# Patient Record
Sex: Female | Born: 1995
Health system: Southern US, Community
[De-identification: ages and names within clinical notes are randomized; demographics above are authoritative.]

## PROBLEM LIST (undated history)

## (undated) DIAGNOSIS — M549 Dorsalgia, unspecified: Secondary | ICD-10-CM

## (undated) DIAGNOSIS — R51 Headache: Secondary | ICD-10-CM

## (undated) DIAGNOSIS — D649 Anemia, unspecified: Secondary | ICD-10-CM

## (undated) HISTORY — DX: Headache: R51

---
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
## 2015-05-19 ENCOUNTER — Emergency Department (HOSPITAL_COMMUNITY)
Admission: EM | Admit: 2015-05-19 | Discharge: 2015-05-19 | Disposition: A | Discharge status: Home / Self Care | Payer: Self-pay | Attending: Emergency Medicine | Admitting: Emergency Medicine

## 2015-05-19 ENCOUNTER — Encounter (HOSPITAL_COMMUNITY): Payer: Self-pay | Admitting: Emergency Medicine

## 2015-05-19 DIAGNOSIS — Y9289 Other specified places as the place of occurrence of the external cause: Secondary | ICD-10-CM | POA: Insufficient documentation

## 2015-05-19 DIAGNOSIS — S70362A Insect bite (nonvenomous), left thigh, initial encounter: Secondary | ICD-10-CM | POA: Insufficient documentation

## 2015-05-19 DIAGNOSIS — Y998 Other external cause status: Secondary | ICD-10-CM | POA: Insufficient documentation

## 2015-05-19 DIAGNOSIS — W57XXXA Bitten or stung by nonvenomous insect and other nonvenomous arthropods, initial encounter: Secondary | ICD-10-CM | POA: Insufficient documentation

## 2015-05-19 DIAGNOSIS — L02416 Cutaneous abscess of left lower limb: Secondary | ICD-10-CM | POA: Insufficient documentation

## 2015-05-19 DIAGNOSIS — L0291 Cutaneous abscess, unspecified: Secondary | ICD-10-CM

## 2015-05-19 DIAGNOSIS — Y9389 Activity, other specified: Secondary | ICD-10-CM | POA: Insufficient documentation

## 2015-05-19 DIAGNOSIS — Z720 Tobacco use: Secondary | ICD-10-CM | POA: Insufficient documentation

## 2015-05-19 MED ORDER — SULFAMETHOXAZOLE-TRIMETHOPRIM 800-160 MG PO TABS: 1.0000 | ORAL_TABLET | Freq: Two times a day (BID) | ORAL | Status: DC

## 2015-05-19 NOTE — Discharge Instructions (Signed)
Apply warm compresses to affected area throughout the day. Take antibiotic until it is finished. Followup with [facility]Primary Care doctor in 3-5 days for wound recheck. Monitor area for signs of infection to include, but not limited to: increasing pain, redness, drainage/pus, or swelling. Return to emergency department for emergent changing or worsening symptoms.    Abscess An abscess (boil or furuncle) is an infected area on or under the skin. This area is filled with yellowish-white fluid (pus) and other material (debris). HOME CARE   Only take medicines as told by your doctor.  If you were given antibiotic medicine, take it as directed. Finish the medicine even if you start to feel better.  If gauze is used, follow your doctor's directions for changing the gauze.  To avoid spreading the infection:  Keep your abscess covered with a bandage.  Wash your hands well.  Do not share personal care items, towels, or whirlpools with others.  Avoid skin contact with others.  Keep your skin and clothes clean around the abscess.  Keep all doctor visits as told. GET HELP RIGHT AWAY IF:   You have more pain, puffiness (swelling), or redness in the wound site.  You have more fluid or blood coming from the wound site.  You have muscle aches, chills, or you feel sick.  You have a fever. MAKE SURE YOU:   Understand these instructions.  Will watch your condition.  Will get help right away if you are not doing well or get worse. Document Released: 03/10/2008 Document Revised: 03/23/2012 Document Reviewed: 12/05/2011 [facility] Patient Information 2015 [place], [place]. This information is not intended to replace advice given to you by your health care provider. Make sure you discuss any questions you have with your health care provider.  Insect Bite Mosquitoes, flies, fleas, bedbugs, and many other insects can bite. Insect bites are different from insect stings. A sting is  when venom is injected into the skin. Some insect bites can transmit infectious diseases. SYMPTOMS  Insect bites usually turn red, swell, and itch for 2 to 4 days. They often go away on their own. TREATMENT  Your caregiver may prescribe antibiotic medicines if a bacterial infection develops in the bite. HOME CARE INSTRUCTIONS  Do not scratch the bite area.  Keep the bite area clean and dry. Wash the bite area thoroughly with soap and water.  Put ice or cool compresses on the bite area.  Put ice in a plastic bag.  Place a towel between your skin and the bag.  Leave the ice on for 20 minutes, 4 times a day for the first 2 to 3 days, or as directed.  You may apply a baking soda paste, cortisone cream, or calamine lotion to the bite area as directed by your caregiver. This can help reduce itching and swelling.  Only take over-the-counter or prescription medicines as directed by your caregiver.  If you are given antibiotics, take them as directed. Finish them even if you start to feel better. You may need a tetanus shot if:  You cannot remember when you had your last tetanus shot.  You have never had a tetanus shot.  The injury broke your skin. If you get a tetanus shot, your arm may swell, get red, and feel warm to the touch. This is common and not a problem. If you need a tetanus shot and you choose not to have one, there is a rare chance of getting tetanus. Sickness from tetanus can be  serious. SEEK IMMEDIATE MEDICAL CARE IF:   You have increased pain, redness, or swelling in the bite area.  You see a red line on the skin coming from the bite.  You have a fever.  You have joint pain.  You have a headache or neck pain.  You have unusual weakness.  You have a rash.  You have chest pain or shortness of breath.  You have abdominal pain, nausea, or vomiting.  You feel unusually tired or sleepy. MAKE SURE YOU:   Understand these instructions.  Will watch your  condition.  Will get help right away if you are not doing well or get worse. Document Released: 10/30/2004 Document Revised: 12/15/2011 Document Reviewed: 04/23/2011 [facility] Patient Information 2015 [place], [place]. This information is not intended to replace advice given to you by your health care provider. Make sure you discuss any questions you have with your health care provider.

## 2015-05-19 NOTE — ED Notes (Signed)
Patient reports insect bite to left leg, here with complaints of increased inflammation to site. "think it may be infected.".

## 2015-05-19 NOTE — ED Provider Notes (Signed)
CSN: [id number]     Arrival date & time 05/19/15  1114 History   First MD Initiated Contact with Patient 05/19/15 1123     Chief Complaint  Patient presents with  . Insect Bite     (Consider location/radiation/quality/duration/timing/severity/associated sxs/prior Treatment) HPI Comments: Claudia Knapp is a 19 y.o. female with a PMHx of headaches, who presents to the ED with complaints of insect bite to the inside of her left thigh 7 days. She states that it began as a small bump and has grown in size, become more red, and slightly warm to the touch, and occasionally itches. She has been applying topical antibiotic ointment with no relief. She describes intermittent 4/10 achy nonradiating pain only with pressure to the area or hot showers. She denies any fevers, chills, red streaking, chest pain, shortness breath, abdominal pain, nausea vomiting, diarrhea, dysuria, hematuria, numbness, tingling, weakness, or arthralgias. Reports that the areas indurated.  Patient is a 19 y.o. female presenting with abscess. The history is provided by the patient. No language interpreter was used.  Abscess Location:  Leg Leg abscess location:  L leg Abscess quality: induration, redness and warmth   Abscess quality: not draining and no fluctuance   Red streaking: no   Duration:  7 days Progression:  Worsening Chronicity:  New Context: insect bite/sting (suspected)   Relieved by:  Topical antibiotics Exacerbated by: pressure to area. Ineffective treatments:  None tried Associated symptoms: no fever, no nausea and no vomiting     Past Medical History  Diagnosis Date  . Headache(784.0)    History reviewed. No pertinent past surgical history. Family History  Problem Relation Age of Onset  . Bone cancer Maternal Grandmother   . Lung cancer Maternal Grandfather   . Stroke Paternal Grandfather    Social History  Substance Use Topics  . Smoking status: Current Every Day Smoker -- 0.50 packs/day   Types: Cigarettes  . Smokeless tobacco: Never Used  . Alcohol Use: No   OB History    No data available     Review of Systems  Constitutional: Negative for fever and chills.  Respiratory: Negative for shortness of breath.   Cardiovascular: Negative for chest pain.  Gastrointestinal: Negative for nausea, vomiting, abdominal pain and diarrhea.  Genitourinary: Negative for dysuria and hematuria.  Musculoskeletal: Positive for myalgias (at area of insect bite). Negative for arthralgias.  Skin: Positive for wound. Negative for color change.  Allergic/Immunologic: Negative for immunocompromised state.  Neurological: Negative for weakness and numbness.  Psychiatric/Behavioral: Negative for confusion.   10 Systems reviewed and are negative for acute change except as noted in the HPI.    Allergies  Maxalt-mlt  Home Medications   Prior to Admission medications   Not on File   BP 100/69 mmHg  Pulse 96  Temp(Src) 98.8 F (37.1 C) (Oral)  Resp 16  SpO2 100% Physical Exam  Constitutional: She is oriented to person, place, and time. Vital signs are normal. She appears well-developed and well-nourished.  Non-toxic appearance. No distress.  Afebrile, nontoxic, NAD  HENT:  Head: Normocephalic and atraumatic.  Mouth/Throat: Mucous membranes are normal.  Eyes: Conjunctivae and EOM are normal. Right eye exhibits no discharge. Left eye exhibits no discharge.  Neck: Normal range of motion. Neck supple.  Cardiovascular: Normal rate.   Pulmonary/Chest: Effort normal. No respiratory distress.  Abdominal: Normal appearance. She exhibits no distension.  Musculoskeletal: Normal range of motion.  Neurological: She is alert and oriented to person, place, and time. She has  normal strength. No sensory deficit.  Skin: Skin is warm, dry and intact. No rash noted. There is erythema.  Small ~0.5cm area of induration located to medial aspect of L distal thigh, mildly erythematous with no warmth, no  drainage, no fluctuance. No surrounding cellulitis.   Psychiatric: She has a normal mood and affect. Her behavior is normal.  Nursing note and vitals reviewed.   ED Course  Procedures (including critical care time) Labs Review Labs Reviewed - No data to display  Imaging Review No results found. I, Camprubi-Soms, [place], personally reviewed and evaluated these images and lab results as part of my medical decision-making.   EKG Interpretation None      MDM   Final diagnoses:  Abscess  Insect bite    19 y.o. female here with small area of possible early abscess after an insect bite. Area is indurated and mildly tender, mildly erythematous, no warmth. Will treat with abx since this has been ongoing x1wk and not improving. Doubt need for I&D now since it's not fluctuant. Will have her f/up with PCP in 3-5 days for recheck. I explained the diagnosis and have given explicit precautions to return to the ER including for any other new or worsening symptoms. The patient understands and accepts the medical plan as it's been dictated and I have answered their questions. Discharge instructions concerning home care and prescriptions have been given. The patient is STABLE and is discharged to home in good condition.  BP 100/69 mmHg  Pulse 96  Temp(Src) 98.8 F (37.1 C) (Oral)  Resp 16  SpO2 100%  Meds ordered this encounter  Medications  . sulfamethoxazole-trimethoprim (BACTRIM DS,SEPTRA DS) 800-160 MG per tablet    Sig: Take 1 tablet by mouth 2 (two) times daily.    Dispense:  14 tablet    Refill:  0    Order Specific Question:  Supervising Provider    Answer:  Evlyn Kanner, PA-C 05/19/15 1145  Laurence Spates, MD 05/20/15 [phone]

## 2015-08-14 ENCOUNTER — Encounter (HOSPITAL_COMMUNITY): Payer: Self-pay | Admitting: Emergency Medicine

## 2015-08-14 ENCOUNTER — Emergency Department (HOSPITAL_COMMUNITY)
Admission: EM | Admit: 2015-08-14 | Discharge: 2015-08-14 | Disposition: A | Discharge status: Home / Self Care | Payer: Medicaid Other | Attending: Emergency Medicine | Admitting: Emergency Medicine

## 2015-08-14 DIAGNOSIS — Z3202 Encounter for pregnancy test, result negative: Secondary | ICD-10-CM | POA: Insufficient documentation

## 2015-08-14 DIAGNOSIS — Z862 Personal history of diseases of the blood and blood-forming organs and certain disorders involving the immune mechanism: Secondary | ICD-10-CM | POA: Insufficient documentation

## 2015-08-14 DIAGNOSIS — R112 Nausea with vomiting, unspecified: Secondary | ICD-10-CM

## 2015-08-14 DIAGNOSIS — Z720 Tobacco use: Secondary | ICD-10-CM | POA: Insufficient documentation

## 2015-08-14 DIAGNOSIS — N3000 Acute cystitis without hematuria: Secondary | ICD-10-CM | POA: Insufficient documentation

## 2015-08-14 DIAGNOSIS — K2970 Gastritis, unspecified, without bleeding: Secondary | ICD-10-CM

## 2015-08-14 HISTORY — DX: Anemia, unspecified: D64.9

## 2015-08-14 LAB — COMPREHENSIVE METABOLIC PANEL
ALT: 23 U/L (ref 14–54)
AST: 29 U/L (ref 15–41)
Albumin: 4.1 g/dL (ref 3.5–5.0)
Alkaline Phosphatase: 87 U/L (ref 38–126)
Anion gap: 6 (ref 5–15)
BUN: 9 mg/dL (ref 6–20)
CO2: 26 mmol/L (ref 22–32)
Calcium: 9.1 mg/dL (ref 8.9–10.3)
Chloride: 106 mmol/L (ref 101–111)
Creatinine, Ser: 0.73 mg/dL (ref 0.44–1.00)
GFR calc Af Amer: >60 mL/min (ref >60)
GFR calc non Af Amer: >60 mL/min (ref >60)
Glucose, Bld: 87 mg/dL (ref 65–99)
Potassium: 3.6 mmol/L (ref 3.5–5.1)
Sodium: 138 mmol/L (ref 135–145)
Total Bilirubin: 0.7 mg/dL (ref 0.3–1.2)
Total Protein: 7.3 g/dL (ref 6.5–8.1)

## 2015-08-14 LAB — URINE MICROSCOPIC-ADD ON

## 2015-08-14 LAB — CBC WITH DIFFERENTIAL/PLATELET
Basophils Absolute: 0 10*3/uL (ref 0.0–0.1)
Basophils Relative: 1 %
Eosinophils Absolute: 0.1 10*3/uL (ref 0.0–0.7)
Eosinophils Relative: 2 %
HCT: 35.9 % — ABNORMAL LOW (ref 36.0–46.0)
Hemoglobin: 11.6 g/dL — ABNORMAL LOW (ref 12.0–15.0)
Lymphocytes Relative: 46 %
Lymphs Abs: 2.1 10*3/uL (ref 0.7–4.0)
MCH: 24.7 pg — ABNORMAL LOW (ref 26.0–34.0)
MCHC: 32.3 g/dL (ref 30.0–36.0)
MCV: 76.5 fL — ABNORMAL LOW (ref 78.0–100.0)
Monocytes Absolute: 0.8 10*3/uL (ref 0.1–1.0)
Monocytes Relative: 18 %
Neutro Abs: 1.5 10*3/uL — ABNORMAL LOW (ref 1.7–7.7)
Neutrophils Relative %: 33 %
Platelets: 322 10*3/uL (ref 150–400)
RBC: 4.69 MIL/uL (ref 3.87–5.11)
RDW: 17 % — ABNORMAL HIGH (ref 11.5–15.5)
WBC: 4.4 10*3/uL (ref 4.0–10.5)

## 2015-08-14 LAB — URINALYSIS, ROUTINE W REFLEX MICROSCOPIC
Bilirubin Urine: NEGATIVE
Glucose, UA: NEGATIVE mg/dL
Ketones, ur: 15 mg/dL — AB
Nitrite: POSITIVE — AB
Protein, ur: 30 mg/dL — AB
Specific Gravity, Urine: 1.032 — ABNORMAL HIGH (ref 1.005–1.030)
Urobilinogen, UA: 1 mg/dL (ref 0.0–1.0)
pH: 8 (ref 5.0–8.0)

## 2015-08-14 LAB — POC URINE PREG, ED: Preg Test, Ur: NEGATIVE

## 2015-08-14 LAB — LIPASE, BLOOD: Lipase: 29 U/L (ref 11–51)

## 2015-08-14 MED ORDER — ONDANSETRON HCL 4 MG/2ML IJ SOLN
4.0000 mg | Freq: Once | INTRAMUSCULAR | Status: DC
  Filled 2015-08-14: qty 2

## 2015-08-14 MED ORDER — SODIUM CHLORIDE 0.9 % IV BOLUS (SEPSIS): 1000.0000 mL | Freq: Once | INTRAVENOUS | Status: DC

## 2015-08-14 MED ORDER — ONDANSETRON 4 MG PO TBDP
4.0000 mg | ORAL_TABLET | Freq: Once | ORAL | Status: AC
  Administered 2015-08-14: 4 mg via ORAL
  Filled 2015-08-14: qty 1

## 2015-08-14 MED ORDER — NITROFURANTOIN MONOHYD MACRO 100 MG PO CAPS: 100.0000 mg | ORAL_CAPSULE | Freq: Two times a day (BID) | ORAL | Status: DC

## 2015-08-14 MED ORDER — ONDANSETRON HCL 4 MG PO TABS: 4.0000 mg | ORAL_TABLET | Freq: Four times a day (QID) | ORAL | Status: DC

## 2015-08-14 NOTE — ED Notes (Signed)
TRAINING RN WILL COLLECT

## 2015-08-14 NOTE — ED Notes (Signed)
Patient states she started vomiting yesterday, has a headache and feels like she is burning up. Emesis x4.

## 2015-08-14 NOTE — Progress Notes (Signed)
CM spoke with pt who [place].  CM discussed and provided written information for uninsured accepting pcps, discussed the importance of pcp vs EDP services for f/u care, www.needymeds.org, www.goodrx.com, discounted pharmacies and other [place] resources such as [facility] , [facility], affordable care act, financial assistance, uninsured dental services, [place] med assist, DSS and  health department  Reviewed resources for [place] uninsured accepting pcps like [facility], family medicine at [address], [facility], [facility], local urgent care centers, [facility], [facility] family [facility], general medical clinics, family services of the [place] [facility], [facility] urgent care plus others, medication resources, [facility] out patient pharmacies and housing Pt voiced understanding and appreciation of resources provided   Provided [facility] contact information Pt [facility] by [facility] staff member who provided pt with resources Pt prefers to [facility] [place] "gets me out of work" vs [place] Pt works at [employer] Pt listed with medicaid family planning  Entered in EPIC  Follow-up With Details Why Contact Info  medicaid family planning patient please review the following web site to understand your coverage Please use the resources provided by the partnership for community care network staff and ED case manager to assist with follow up doctor care and other resources [place]: [place]: [phone] (main) CommodityPost.eshttps://dma.ncdhhs.gov/ https://scott-booker.info/http://dma.ncdhhs.gov/medicaid/get-started/find-programs-and-services/be-smart-family-planning-program  As a Medicaid client you MUST contact them each time you change address, move to another county or another state to keep your address updated Encouraged use of goodrx to assist with cost of medications

## 2015-08-14 NOTE — ED Provider Notes (Signed)
CSN: [id number]     Arrival date & time 08/14/15  4098 History   First MD Initiated Contact with Patient 08/14/15 1013     Chief Complaint  Patient presents with  . Emesis    HPI   19 year old female presents today with complaints of emesis. Patient reports that 2 days ago she started developing nausea with one episode of vomiting. She reports this continued throughout the day yesterday with 3 episodes of non-bloody vomiting yesterday. She reports associated fatigue and subjective fever. She reports that she's been able to tolerate small amounts of fluids, but has no appetite for food. Patient denies any exposure to abnormal food and drink, no one else experiencing similar symptoms, she denies abdominal pain, changes in urine color clarity or characteristics. Patient denies any changes in her bowel habits, reporting normal bowel movement yesterday. No history of abdominal surgery. Patient reports she's currently on her menstrual cycle. No previous similar episodes.  Past Medical History  Diagnosis Date  . Headache(784.0)   . Anemia    History reviewed. No pertinent past surgical history. Family History  Problem Relation Age of Onset  . Bone cancer Maternal Grandmother   . Lung cancer Maternal Grandfather   . Stroke Paternal Grandfather    Social History  Substance Use Topics  . Smoking status: Current Every Day Smoker -- 0.00 packs/day    Types: Cigars  . Smokeless tobacco: Never Used  . Alcohol Use: No     Comment: occ   OB History    No data available     Review of Systems  All other systems reviewed and are negative.   Allergies  Maxalt-mlt  Home Medications   Prior to Admission medications   Medication Sig Start Date End Date Taking? Authorizing Provider  acetaminophen (TYLENOL) 500 MG tablet Take 1,000 mg by mouth every 6 (six) hours as needed for mild pain, moderate pain, fever or headache.   Yes Historical Provider, MD  nitrofurantoin, macrocrystal-monohydrate,  (MACROBID) 100 MG capsule Take 1 capsule (100 mg total) by mouth 2 (two) times daily. 08/14/15   Tinnie Gens Reathel Turi, PA-C  ondansetron (ZOFRAN) 4 MG tablet Take 1 tablet (4 mg total) by mouth every 6 (six) hours. 08/14/15   Eyvonne Mechanic, PA-C  sulfamethoxazole-trimethoprim (BACTRIM DS,SEPTRA DS) 800-160 MG per tablet Take 1 tablet by mouth 2 (two) times daily. Patient not taking: Reported on 08/14/2015 05/19/15   Mercedes Camprubi-Soms, PA-C   BP 100/54 mmHg  Pulse 72  Temp(Src) 98.2 F (36.8 C) (Oral)  Resp 18  Ht  (1.549 m)  Wt 140 lb (63.504 kg)  BMI 26.47 kg/m2  SpO2 100%  LMP 08/12/2015 (Approximate)   Physical Exam  Constitutional: She is oriented to person, place, and time. She appears well-developed and well-nourished.  HENT:  Head: Normocephalic and atraumatic.  Eyes: Conjunctivae are normal. Pupils are equal, round, and reactive to light. Right eye exhibits no discharge. Left eye exhibits no discharge. No scleral icterus.  Neck: Normal range of motion. No JVD present. No tracheal deviation present.  Cardiovascular: Normal rate, regular rhythm, normal heart sounds and intact distal pulses.  Exam reveals no gallop and no friction rub.   No murmur heard. Pulmonary/Chest: Effort normal. No stridor.  Abdominal: Soft. Bowel sounds are normal. She exhibits no distension and no mass. There is no tenderness. There is no rebound and no guarding.  Musculoskeletal: Normal range of motion. She exhibits no edema or tenderness.  Neurological: She is alert and oriented to person,  place, and time. Coordination normal.  Skin: Skin is warm and dry. No rash noted. No erythema. No pallor.  Psychiatric: She has a normal mood and affect. Her behavior is normal. Judgment and thought content normal.  Nursing note and vitals reviewed.   ED Course  Procedures (including critical care time) Labs Review Labs Reviewed  CBC WITH DIFFERENTIAL/PLATELET - Abnormal; Notable for the following:     Hemoglobin 11.6 (*)    HCT 35.9 (*)    MCV 76.5 (*)    MCH 24.7 (*)    RDW 17.0 (*)    Neutro Abs 1.5 (*)    All other components within normal limits  URINALYSIS, ROUTINE W REFLEX MICROSCOPIC (NOT AT [facility]) - Abnormal; Notable for the following:    Color, Urine AMBER (*)    APPearance TURBID (*)    Specific Gravity, Urine 1.032 (*)    Hgb urine dipstick MODERATE (*)    Ketones, ur 15 (*)    Protein, ur 30 (*)    Nitrite POSITIVE (*)    Leukocytes, UA SMALL (*)    All other components within normal limits  URINE MICROSCOPIC-ADD ON - Abnormal; Notable for the following:    Squamous Epithelial / LPF MANY (*)    Bacteria, UA MANY (*)    Crystals TRIPLE PHOSPHATE CRYSTALS (*)    All other components within normal limits  COMPREHENSIVE METABOLIC PANEL  LIPASE, BLOOD  POC URINE PREG, ED    Imaging Review No results found. I have personally reviewed and evaluated these images and lab results as part of my medical decision-making.   EKG Interpretation None      MDM   Final diagnoses:  Gastritis  Acute cystitis without hematuria  Non-intractable vomiting with nausea, vomiting of unspecified type    Labs: CBC, CMP, lipase, urinalysis  Imaging:  Consults:  Therapeutics: Normal saline, Zofran  Discharge Meds: Zofran, nitrofurantoin  Assessment/Plan: Patient's presentation most consistent with gastritis. Urinalysis shows nitrite-positive urine, with only 0-2 WBCs in the urine. Question of urinary tract infection. Patient doesn't have any urinary complaints, watch and wait discussed with patient who requested antibiotic therapy. She will be discharged home with antibiotics for this. She is afebrile, vital signs reassuring, remainder of laboratory findings reassuring. Patient was instructed to use Zofran as needed for nausea, complete antibiotic therapy, follow up with primary care provider in 3 days for reevaluation. She is given strict return precautions, verbalized  understanding and agreement for today's plan and had no further questions concerns at time of discharge         Eyvonne MechanicJeffrey Lance [place], PA-C 08/16/15 1656  Melene Planan [place], DO 08/17/15 1520

## 2015-08-14 NOTE — Discharge Instructions (Signed)
Gastritis, Adult °Gastritis is soreness and swelling (inflammation) of the lining of the stomach. Gastritis can develop as a sudden onset (acute) or long-term (chronic) condition. If gastritis is not treated, it can lead to stomach bleeding and ulcers. °CAUSES  °Gastritis occurs when the stomach lining is weak or damaged. Digestive juices from the stomach then inflame the weakened stomach lining. The stomach lining may be weak or damaged due to viral or bacterial infections. One common bacterial infection is the Helicobacter pylori infection. Gastritis can also result from excessive alcohol consumption, taking certain medicines, or having too much acid in the stomach.  °SYMPTOMS  °In some cases, there are no symptoms. When symptoms are present, they may include: °· Pain or a burning sensation in the upper abdomen. °· Nausea. °· Vomiting. °· An uncomfortable feeling of fullness after eating. °DIAGNOSIS  °Your caregiver may suspect you have gastritis based on your symptoms and a physical exam. To determine the cause of your gastritis, your caregiver may perform the following: °· Blood or stool tests to check for the H pylori bacterium. °· Gastroscopy. A thin, flexible tube (endoscope) is passed down the esophagus and into the stomach. The endoscope has a light and camera on the end. Your caregiver uses the endoscope to view the inside of the stomach. °· Taking a tissue sample (biopsy) from the stomach to examine under a microscope. °TREATMENT  °Depending on the cause of your gastritis, medicines may be prescribed. If you have a bacterial infection, such as an H pylori infection, antibiotics may be given. If your gastritis is caused by too much acid in the stomach, H2 blockers or antacids may be given. Your caregiver may recommend that you stop taking aspirin, ibuprofen, or other nonsteroidal anti-inflammatory drugs (NSAIDs). °HOME CARE INSTRUCTIONS °· Only take over-the-counter or prescription medicines as directed by  your caregiver. °· If you were given antibiotic medicines, take them as directed. Finish them even if you start to feel better. °· Drink enough fluids to keep your urine clear or pale yellow. °· Avoid foods and drinks that make your symptoms worse, such as: °¨ Caffeine or alcoholic drinks. °¨ Chocolate. °¨ Peppermint or mint flavorings. °¨ Garlic and onions. °¨ Spicy foods. °¨ Citrus fruits, such as oranges, lemons, or limes. °¨ Tomato-based foods such as sauce, chili, salsa, and pizza. °¨ Fried and fatty foods. °· Eat small, frequent meals instead of large meals. °SEEK IMMEDIATE MEDICAL CARE IF:  °· You have black or dark red stools. °· You vomit blood or material that looks like coffee grounds. °· You are unable to keep fluids down. °· Your abdominal pain gets worse. °· You have a fever. °· You do not feel better after 1 week. °· You have any other questions or concerns. °MAKE SURE YOU: °· Understand these instructions. °· Will watch your condition. °· Will get help right away if you are not doing well or get worse. °  °This information is not intended to replace advice given to you by your health care provider. Make sure you discuss any questions you have with your health care provider. °  °Document Released: 09/16/2001 Document Revised: 03/23/2012 Document Reviewed: 11/05/2011 °Elsevier Interactive Patient Education ©2016 Elsevier Inc. ° °Please read attached information. If you experience any new or worsening signs or symptoms please return to the emergency room for evaluation. Please follow-up with your primary care provider or specialist as discussed. Please use medication prescribed only as directed and discontinue taking if you have any concerning signs   or symptoms.  ° °

## 2016-11-19 ENCOUNTER — Encounter (HOSPITAL_COMMUNITY): Payer: Self-pay | Admitting: Family Medicine

## 2016-11-19 ENCOUNTER — Emergency Department (HOSPITAL_COMMUNITY)
Admission: EM | Admit: 2016-11-19 | Discharge: 2016-11-20 | Disposition: A | Discharge status: Home / Self Care | Payer: Medicaid Other | Attending: Emergency Medicine | Admitting: Emergency Medicine

## 2016-11-19 DIAGNOSIS — F17290 Nicotine dependence, other tobacco product, uncomplicated: Secondary | ICD-10-CM | POA: Insufficient documentation

## 2016-11-19 DIAGNOSIS — J069 Acute upper respiratory infection, unspecified: Secondary | ICD-10-CM | POA: Insufficient documentation

## 2016-11-19 MED ORDER — FLUTICASONE PROPIONATE 50 MCG/ACT NA SUSP: 1.0000 | Freq: Every day | NASAL | 0 refills | Status: DC

## 2016-11-19 NOTE — Discharge Instructions (Signed)
Please read and follow all provided instructions.  Your diagnoses today include:  1. Upper respiratory tract infection, unspecified type     Tests performed today include: Vital signs. See below for your results today.   Medications prescribed:  Take as prescribed   Home care instructions:  Follow any educational materials contained in this packet.  Follow-up instructions: Please follow-up with your primary care provider for further evaluation of symptoms and treatment   Return instructions:  Please return to the Emergency Department if you do not get better, if you get worse, or new symptoms OR  - Fever (temperature greater than 101.61F)  - Bleeding that does not stop with holding pressure to the area    -Severe pain (please note that you may be more sore the day after your accident)  - Chest Pain  - Difficulty breathing  - Severe nausea or vomiting  - Inability to tolerate food and liquids  - Passing out  - Skin becoming red around your wounds  - Change in mental status (confusion or lethargy)  - New numbness or weakness    Please return if you have any other emergent concerns.  Additional Information:  Your vital signs today were: BP 90/74 (BP Location: Right Arm)    Pulse 86    Temp 98.2 F (36.8 C) (Oral)    Resp 18    Ht 5\' 1"  (1.549 m)    Wt 72.2 kg    LMP 11/15/2016    SpO2 100%    BMI 30.07 kg/m  If your blood pressure (BP) was elevated above 135/85 this visit, please have this repeated by your doctor within one month. ---------------

## 2016-11-19 NOTE — ED Triage Notes (Signed)
Patient reports she is experiencing right ear ache. Initially, the right ear had a ringing for about two before becoming painful. Also, reports feeling like her throat is dry.

## 2016-11-19 NOTE — ED Provider Notes (Signed)
[facility]-EMERGENCY DEPT Provider Note   CSN: [id number] Arrival date & time: 11/19/16  2250     History   Chief Complaint Chief Complaint  Patient presents with  . Otalgia    HPI Claudia Knapp is a 21 y.o. female.  HPI  21 y.o. female, presents to the Emergency Department today complaining of right ear pain today. Notes fullness sensation. Associated sinus pressure. No fevers. No N/V/D. No CP/SOB. No pain currently. No sick contacts. No discharge from ear. No decrease in hearing. Attempted Nyquil with minimal  Relief. No other symptoms noted.   Past Medical History:  Diagnosis Date  . Anemia   . WUJWJXBJ(478.2Headache(784.0)     Patient Active Problem List   Diagnosis Date Noted  . Migraine without aura, without mention of intractable migraine without mention of status migrainosus 03/21/2013  . Tension headache 03/21/2013    History reviewed. No pertinent surgical history.  OB History    No data available       Home Medications    Prior to Admission medications   Medication Sig Start Date End Date Taking? Authorizing Provider  acetaminophen (TYLENOL) 500 MG tablet Take 1,000 mg by mouth every 6 (six) hours as needed for mild pain, moderate pain, fever or headache.    Historical Provider, MD  nitrofurantoin, macrocrystal-monohydrate, (MACROBID) 100 MG capsule Take 1 capsule (100 mg total) by mouth 2 (two) times daily. 08/14/15   Tinnie GensJeffrey Hedges, PA-C  ondansetron (ZOFRAN) 4 MG tablet Take 1 tablet (4 mg total) by mouth every 6 (six) hours. 08/14/15   Eyvonne MechanicJeffrey Hedges, PA-C  sulfamethoxazole-trimethoprim (BACTRIM DS,SEPTRA DS) 800-160 MG per tablet Take 1 tablet by mouth 2 (two) times daily. Patient not taking: Reported on 08/14/2015 05/19/15   Rhona RaiderMercedes Street, PA-C    Family History Family History  Problem Relation Age of Onset  . Lung cancer Maternal Grandfather   . Stroke Paternal Grandfather   . Bone cancer Maternal Grandmother     Social History Social History  Substance Use  Topics  . Smoking status: Current Every Day Smoker    Packs/day: 0.00    Types: Cigars  . Smokeless tobacco: Never Used  . Alcohol use Yes     Comment: Twice a month.      Allergies   Maxalt-mlt [rizatriptan benzoate]   Review of Systems Review of Systems  Constitutional: Negative for fever.  HENT: Positive for ear pain.    Physical Exam Updated Vital Signs BP 90/74 (BP Location: Right Arm)   Pulse 86   Temp 98.2 F (36.8 C) (Oral)   Resp 18   Ht 5\' 1"  (1.549 m)   Wt 72.2 kg   LMP 11/15/2016   SpO2 100%   BMI 30.07 kg/m   Physical Exam  Constitutional: She is oriented to person, place, and time. She appears well-developed and well-nourished. No distress.  HENT:  Head: Normocephalic and atraumatic.  Right Ear: Hearing, tympanic membrane, external ear and ear canal normal.  Left Ear: Hearing, tympanic membrane, external ear and ear canal normal.  Nose: Nose normal.  Mouth/Throat: Uvula is midline, oropharynx is clear and moist and mucous membranes are normal. No trismus in the jaw. No oropharyngeal exudate, posterior oropharyngeal erythema or tonsillar abscesses.  Eyes: EOM are normal. Pupils are equal, round, and reactive to light.  Neck: Normal range of motion. Neck supple. No tracheal deviation present.  Cardiovascular: Normal rate, regular rhythm, S1 normal, S2 normal, normal heart sounds, intact distal pulses and normal pulses.   Pulmonary/Chest:  Effort normal and breath sounds normal. No respiratory distress. She has no decreased breath sounds. She has no wheezes. She has no rhonchi. She has no rales.  Abdominal: Normal appearance and bowel sounds are normal. There is no tenderness.  Musculoskeletal: Normal range of motion.  Neurological: She is alert and oriented to person, place, and time.  Skin: Skin is warm and dry.  Psychiatric: She has a normal mood and affect. Her speech is normal and behavior is normal. Thought content normal.     ED Treatments /  Results  Labs (all labs ordered are listed, but only abnormal results are displayed) Labs Reviewed - No data to display  EKG  EKG Interpretation None       Radiology No results found.  Procedures Procedures (including critical care time)  Medications Ordered in ED Medications - No data to display   Initial Impression / Assessment and Plan / ED Course  I have reviewed the triage vital signs and the nursing notes.  Pertinent labs & imaging results that were available during my care of the patient were reviewed by me and considered in my medical decision making (see chart for details).    Final Clinical Impressions(s) / ED Diagnoses     {I have reviewed the relevant previous healthcare records.  {I obtained HPI from historian.   ED Course:  Assessment: Pt is a 20yF presents with Right ear pain and sinus pressure today . On exam, pt in NAD. VSS. Afebrile. Lungs CTA, Heart RRR. Abdomen nontender/soft. Patients symptoms are consistent with URI, likely viral etiology. Discussed that antibiotics are not indicated for viral infections. Pt will be discharged with symptomatic treatment.  Verbalizes understanding and is agreeable with plan. Pt is hemodynamically stable & in NAD prior to dc  Disposition/Plan:  DC Home Additional Verbal discharge instructions given and discussed with patient.  Pt Instructed to f/u with PCP in the next week for evaluation and treatment of symptoms. Return precautions given Pt acknowledges and agrees with plan  Supervising Physician Canary Brim Tegeler, MD  Final diagnoses:  Upper respiratory tract infection, unspecified type    New Prescriptions New Prescriptions   No medications on file     Audry Pili, PA-C 11/19/16 2350    Canary Brim Tegeler, MD 11/20/16 1151

## 2017-09-01 ENCOUNTER — Emergency Department (HOSPITAL_COMMUNITY)
Admission: EM | Admit: 2017-09-01 | Discharge: 2017-09-01 | Disposition: A | Discharge status: Home / Self Care | Payer: Self-pay | Attending: Emergency Medicine | Admitting: Emergency Medicine

## 2017-09-01 ENCOUNTER — Encounter (HOSPITAL_COMMUNITY): Payer: Self-pay

## 2017-09-01 DIAGNOSIS — Z79899 Other long term (current) drug therapy: Secondary | ICD-10-CM | POA: Insufficient documentation

## 2017-09-01 DIAGNOSIS — F17210 Nicotine dependence, cigarettes, uncomplicated: Secondary | ICD-10-CM | POA: Insufficient documentation

## 2017-09-01 DIAGNOSIS — K0889 Other specified disorders of teeth and supporting structures: Secondary | ICD-10-CM | POA: Insufficient documentation

## 2017-09-01 MED ORDER — KETOROLAC TROMETHAMINE 30 MG/ML IJ SOLN
30.0000 mg | Freq: Once | INTRAMUSCULAR | Status: AC
  Administered 2017-09-01: 30 mg via INTRAMUSCULAR
  Filled 2017-09-01: qty 1

## 2017-09-01 MED ORDER — TRAMADOL HCL 50 MG PO TABS: 50.0000 mg | ORAL_TABLET | Freq: Four times a day (QID) | ORAL | 0 refills | Status: DC | PRN

## 2017-09-01 MED ORDER — TRAMADOL HCL 50 MG PO TABS
50.0000 mg | ORAL_TABLET | Freq: Once | ORAL | Status: AC
  Administered 2017-09-01: 50 mg via ORAL
  Filled 2017-09-01: qty 1

## 2017-09-01 NOTE — ED Notes (Signed)
Unable to collect BP due to PT active vomiting

## 2017-09-01 NOTE — Discharge Instructions (Signed)
Please read and follow all provided instructions.  Your diagnoses today include:  1. Pain, dental     Tests performed today include: Vital signs. See below for your results today.   Medications prescribed:  Take as prescribed   Home care instructions:  Follow any educational materials contained in this packet.  Follow-up instructions: Please follow-up with a dentist for further evaluation of symptoms and treatment   Return instructions:  Please return to the Emergency Department if you do not get better, if you get worse, or new symptoms OR  - Fever (temperature greater than 101.42F)  - Bleeding that does not stop with holding pressure to the area    -Severe pain (please note that you may be more sore the day after your accident)  - Chest Pain  - Difficulty breathing  - Severe nausea or vomiting  - Inability to tolerate food and liquids  - Passing out  - Skin becoming red around your wounds  - Change in mental status (confusion or lethargy)  - New numbness or weakness    Please return if you have any other emergent concerns.  Additional Information:  Your vital signs today were: BP 123/89 (BP Location: Right Arm)    Pulse 70    Temp 98.4 F (36.9 C)    Resp 18    Ht 5\' 1"  (1.549 m)    Wt 65.8 kg (145 lb)    SpO2 100%    BMI 27.40 kg/m  If your blood pressure (BP) was elevated above 135/85 this visit, please have this repeated by your doctor within one month. ---------------

## 2017-09-01 NOTE — ED Triage Notes (Signed)
Pt arrives with complaints of ride sided mouth pain for a month but today has become constant. Took ibuprofen for pain with no relief. Took one percocet and vomited in triage. Some facial swelling noted.

## 2017-09-01 NOTE — ED Provider Notes (Signed)
[facility]-EMERGENCY DEPT Provider Note   CSN: [id number] Arrival date & time: 09/01/17  0459     History   Chief Complaint Chief Complaint  Patient presents with  . Dental Pain    HPI Claudia Knapp is a 21 y.o. female.  HPI  21 y.o. female presents to the Emergency Department today due to dental pain. Occurred this morning. Notes hx of same for several weeks with tooth ache. Saw Orthodontist on Wednesday and told it was likely cracked filling. Pt does not have dentist. Notes pain 8/10. Throbbing. Worse with chewing. No fevers. No swelling. Took motrin with minimal relief. No meds PTA. No other symptoms noted    Past Medical History:  Diagnosis Date  . Anemia   . WUJWJXBJ(478.2Headache(784.0)     Patient Active Problem List   Diagnosis Date Noted  . Migraine without aura, without mention of intractable migraine without mention of status migrainosus 03/21/2013  . Tension headache 03/21/2013    History reviewed. No pertinent surgical history.  OB History    No data available       Home Medications    Prior to Admission medications   Medication Sig Start Date End Date Taking? Authorizing Provider  ibuprofen (ADVIL,MOTRIN) 200 MG tablet Take 400 mg by mouth every 6 (six) hours as needed for moderate pain.   Yes [provider]    Family History Family History  Problem Relation Age of Onset  . Lung cancer Maternal Grandfather   . Stroke Paternal Grandfather   . Bone cancer Maternal Grandmother     Social History Social History   Tobacco Use  . Smoking status: Current Every Day Smoker    Packs/day: 0.00    Types: Cigars  . Smokeless tobacco: Never Used  Substance Use Topics  . Alcohol use: Yes    Comment: Twice a month.   . Drug use: Yes    Frequency: 3.0 times per week    Types: Marijuana     Allergies   Maxalt-mlt [rizatriptan benzoate]   Review of Systems Review of Systems ROS reviewed and all are negative for acute  change except as noted in the HPI.  Physical Exam Updated Vital Signs BP 123/89 (BP Location: Right Arm)   Pulse 70   Temp 98.4 F (36.9 C)   Resp 18   Ht 5\' 1"  (1.549 m)   Wt 65.8 kg (145 lb)   SpO2 100%   BMI 27.40 kg/m   Physical Exam  Constitutional: She is oriented to person, place, and time. Vital signs are normal. She appears well-developed and well-nourished. No distress.  HENT:  Head: Normocephalic and atraumatic.  Right Ear: Hearing, tympanic membrane, external ear and ear canal normal.  Left Ear: Hearing, tympanic membrane, external ear and ear canal normal.  Nose: Nose normal.  Mouth/Throat: Uvula is midline, oropharynx is clear and moist and mucous membranes are normal. No trismus in the jaw. No oropharyngeal exudate, posterior oropharyngeal erythema or tonsillar abscesses.  Posterior oropharynx unremarkable. No swelling. No trismus. No dental abscess. No dental caries noted. No facial swelling. No erythema. Area of concern with potential cracked enamel   Eyes: Conjunctivae and EOM are normal. Pupils are equal, round, and reactive to light.  Neck: Normal range of motion. Neck supple. No tracheal deviation present.  Cardiovascular: Normal rate, regular rhythm, S1 normal, S2 normal, normal heart sounds, intact distal pulses and normal pulses.  Pulmonary/Chest: Effort normal and breath sounds normal. No respiratory distress. She has no  decreased breath sounds. She has no wheezes. She has no rhonchi. She has no rales.  Abdominal: Normal appearance and bowel sounds are normal. There is no tenderness.  Musculoskeletal: Normal range of motion.  Neurological: She is alert and oriented to person, place, and time.  Skin: Skin is warm and dry.  Psychiatric: She has a normal mood and affect. Her speech is normal and behavior is normal. Thought content normal.  Nursing note and vitals reviewed.    ED Treatments / Results  Labs (all labs ordered are listed, but only abnormal  results are displayed) Labs Reviewed - No data to display  EKG  EKG Interpretation None       Radiology No results found.  Procedures Procedures (including critical care time)  Medications Ordered in ED Medications - No data to display   Initial Impression / Assessment and Plan / ED Course  I have reviewed the triage vital signs and the nursing notes.  Pertinent labs & imaging results that were available during my care of the patient were reviewed by me and considered in my medical decision making (see chart for details).  Final Clinical Impressions(s) / ED Diagnoses     {I have reviewed the relevant previous healthcare records.  {I obtained HPI from historian.   ED Course:  Assessment: Dental pain associated with dental cary but no signs or symptoms of dental abscess with patient afebrile, non toxic appearing and swallowing secretions well. Exam unconcerning for Ludwig's angina or other deep tissue infection in neck. As there is no facial swelling or gum findings, will not prescribe antibiotics at this time. Will treat with pain medication.  I gave patient referral to dentist and stressed the importance of dental follow up for ultimate management of dental pain. Patient voices understanding and is agreeable to plan.  Disposition/Plan:  DC home Additional Verbal discharge instructions given and discussed with patient.  Pt Instructed to f/u with Dentist in the next week for evaluation and treatment of symptoms. Return precautions given Pt acknowledges and agrees with plan  Supervising Physician Azalia Bilisampos, Kevin, MD  Final diagnoses:  Pain, dental    ED Discharge Orders    None       Audry PiliMohr, Kameshia Madruga, PA-C 09/01/17 0554    Azalia Bilisampos, Kevin, MD 09/01/17 [phone]

## 2017-10-29 ENCOUNTER — Other Ambulatory Visit: Payer: Self-pay

## 2017-10-29 ENCOUNTER — Encounter (HOSPITAL_COMMUNITY): Payer: Self-pay | Admitting: Emergency Medicine

## 2017-10-29 ENCOUNTER — Emergency Department (HOSPITAL_COMMUNITY)
Admission: EM | Admit: 2017-10-29 | Discharge: 2017-10-30 | Disposition: A | Discharge status: Home / Self Care | Payer: Self-pay | Attending: Emergency Medicine | Admitting: Emergency Medicine

## 2017-10-29 DIAGNOSIS — F17290 Nicotine dependence, other tobacco product, uncomplicated: Secondary | ICD-10-CM | POA: Insufficient documentation

## 2017-10-29 DIAGNOSIS — N938 Other specified abnormal uterine and vaginal bleeding: Secondary | ICD-10-CM

## 2017-10-29 DIAGNOSIS — L03011 Cellulitis of right finger: Secondary | ICD-10-CM

## 2017-10-29 DIAGNOSIS — D649 Anemia, unspecified: Secondary | ICD-10-CM | POA: Insufficient documentation

## 2017-10-29 LAB — POC URINE PREG, ED: Preg Test, Ur: NEGATIVE

## 2017-10-29 NOTE — ED Triage Notes (Signed)
Pt states for the past 3 days her middle finger on her right hand has been painful and swollen around her nail bed  Pt also is c/o abnormal vaginal bleeding  Pt states she had her normal period the end of December and then she had sex New Years Eve and had spotting for 3 days then quit for 2 days then had heavy bleeding with large clots   Pt states she has been bleeding all this month

## 2017-10-29 NOTE — ED Provider Notes (Signed)
[facility]-EMERGENCY DEPT Provider Note   CSN: [id number] Arrival date & time: 10/29/17  1936     History   Chief Complaint Chief Complaint  Patient presents with  . finger swelling  . Vaginal Bleeding    HPI Claudia Knapp is a 22 y.o. female.  HPI  22 year old female presents with 2 chief complaints.  First is right middle finger pain and concern for infection.  She has noticed swelling and pain for about 6 days, progressive.  She thinks is from having had her nails done recently.  She has not noticed any discharge.  Pain worsens with any type of movement.  No injuries.  She also is complaining of vaginal bleeding.  Her last menstrual cycle was ending on December 26.  She had intercourse on December 31 and had some spotting on and off for the next couple days.  Intercourse again on January 4 and since then has had bleeding consistently since January 5.  Yesterday the bleeding seemed to slow down and resolved but then came back today.  She states she has had intermittent lightheadedness and dizziness but none now.  Some abdominal pain and low back pain that comes and goes that feels like menstrual cramps.  None now.  No urinary symptoms.  She uses tampons but does not know how many she is using per day.  The bleeding often has clots in it.  No recent birth control use.  She has taken 2 home pregnancy tests that are negative.  Past Medical History:  Diagnosis Date  . Anemia   . WUJWJXBJ(478.2Headache(784.0)     Patient Active Problem List   Diagnosis Date Noted  . Migraine without aura, without mention of intractable migraine without mention of status migrainosus 03/21/2013  . Tension headache 03/21/2013    History reviewed. No pertinent surgical history.  OB History    No data available       Home Medications    Prior to Admission medications   Medication Sig Start Date End Date Taking? Authorizing Provider  ibuprofen (ADVIL,MOTRIN) 200 MG tablet Take 400 mg by  mouth every 6 (six) hours as needed for moderate pain.   Yes [provider]  mupirocin cream (BACTROBAN) 2 % Apply 1 application topically 2 (two) times daily. Apply to right middle finger 10/30/17   Pricilla LovelessGoldston, Evaluna Utke, MD  traMADol (ULTRAM) 50 MG tablet Take 1 tablet (50 mg total) by mouth every 6 (six) hours as needed. Patient not taking: Reported on 10/29/2017 09/01/17   Audry PiliMohr, Tyler, PA-C    Family History Family History  Problem Relation Age of Onset  . Lung cancer Maternal Grandfather   . Stroke Paternal Grandfather   . Bone cancer Maternal Grandmother     Social History Social History   Tobacco Use  . Smoking status: Current Every Day Smoker    Packs/day: 0.00    Types: Cigars  . Smokeless tobacco: Never Used  Substance Use Topics  . Alcohol use: Yes    Comment: Twice a month.   . Drug use: Yes    Frequency: 3.0 times per week    Types: Marijuana     Allergies   Maxalt-mlt [rizatriptan benzoate]   Review of Systems Review of Systems  Constitutional: Negative for fever.  Gastrointestinal: Positive for abdominal pain.  Genitourinary: Positive for vaginal bleeding. Negative for dysuria and vaginal discharge.  Musculoskeletal: Positive for arthralgias, back pain and joint swelling.  Neurological: Positive for dizziness and light-headedness.  All other systems  reviewed and are negative.    Physical Exam Updated Vital Signs BP 106/67 (BP Location: Left Arm)   Pulse 85   Temp 98.7 F (37.1 C) (Oral)   Resp 18   SpO2 100%   Physical Exam  Constitutional: She is oriented to person, place, and time. She appears well-developed and well-nourished. No distress.  HENT:  Head: Normocephalic and atraumatic.  Right Ear: External ear normal.  Left Ear: External ear normal.  Nose: Nose normal.  Eyes: Right eye exhibits no discharge. Left eye exhibits no discharge.  Cardiovascular: Normal rate, regular rhythm and normal heart sounds.  Pulmonary/Chest: Effort  normal and breath sounds normal.  Abdominal: Soft. She exhibits no distension. There is no tenderness.  Musculoskeletal:       Right hand: She exhibits tenderness and swelling. She exhibits normal range of motion. Normal sensation noted.  Mild swelling just proximal to right middle finger nail. Nail appears normal. Small amount of medial swelling. Finger pad soft and no tenderness. No erythema. No purulent drainage. No palpable abscess  Neurological: She is alert and oriented to person, place, and time.  Skin: Skin is warm and dry. She is not diaphoretic.  Nursing note and vitals reviewed.    ED Treatments / Results  Labs (all labs ordered are listed, but only abnormal results are displayed) Labs Reviewed  CBC WITH DIFFERENTIAL/PLATELET - Abnormal; Notable for the following components:      Result Value   Hemoglobin 10.7 (*)    HCT 32.5 (*)    MCV 75.4 (*)    MCH 24.8 (*)    RDW 20.8 (*)    All other components within normal limits  BASIC METABOLIC PANEL - Abnormal; Notable for the following components:   CO2 20 (*)    Glucose, Bld 114 (*)    All other components within normal limits  RPR  POC URINE PREG, ED  GC/CHLAMYDIA PROBE AMP ([place]) NOT AT [facility]    EKG  EKG Interpretation None       Radiology No results found.  Procedures Procedures (including critical care time)  Medications Ordered in ED Medications  mupirocin cream (BACTROBAN) 2 % (not administered)     Initial Impression / Assessment and Plan / ED Course  I have reviewed the triage vital signs and the nursing notes.  Pertinent labs & imaging results that were available during my care of the patient were reviewed by me and considered in my medical decision making (see chart for details).     Patient appears to have a mild paronychia without abscess.  She will be treated with mupirocin cream.  Discussed return precautions and discussed that she may need incision and drainage, which she is  hesitant to do at this time.  However without an obvious abscess I do not think would be particular beneficial now.  As for her bleeding, her hemoglobin is 10.7 which is slightly lower than a couple years ago.  She is has chronic anemia but does not take iron.  She was advised to start iron and follow-up with OB/GYN.  She declines vaginal exam at this time.  My suspicion for bleeding or other acute emergency is low but I discussed that I cannot rule these things out without an exam.  Her abdominal exam is benign.  Discussed return precautions.  Final Clinical Impressions(s) / ED Diagnoses   Final diagnoses:  Dysfunctional uterine bleeding  Paronychia, finger, right    ED Discharge Orders  Ordered    mupirocin cream (BACTROBAN) 2 %  2 times daily     10/30/17 0118       Pricilla Loveless, MD 10/30/17 [id number]

## 2017-10-30 LAB — CBC WITH DIFFERENTIAL/PLATELET
BASOS PCT: 0 %
Basophils Absolute: 0 10*3/uL (ref 0.0–0.1)
Eosinophils Absolute: 0.1 10*3/uL (ref 0.0–0.7)
Eosinophils Relative: 2 %
HEMATOCRIT: 32.5 % — AB (ref 36.0–46.0)
HEMOGLOBIN: 10.7 g/dL — AB (ref 12.0–15.0)
LYMPHS PCT: 44 %
Lymphs Abs: 2.6 10*3/uL (ref 0.7–4.0)
MCH: 24.8 pg — ABNORMAL LOW (ref 26.0–34.0)
MCHC: 32.9 g/dL (ref 30.0–36.0)
MCV: 75.4 fL — ABNORMAL LOW (ref 78.0–100.0)
MONOS PCT: 11 %
Monocytes Absolute: 0.6 10*3/uL (ref 0.1–1.0)
NEUTROS PCT: 43 %
Neutro Abs: 2.5 10*3/uL (ref 1.7–7.7)
Platelets: 324 10*3/uL (ref 150–400)
RBC: 4.31 MIL/uL (ref 3.87–5.11)
RDW: 20.8 % — ABNORMAL HIGH (ref 11.5–15.5)
WBC: 5.8 10*3/uL (ref 4.0–10.5)

## 2017-10-30 LAB — BASIC METABOLIC PANEL
Anion gap: 8 (ref 5–15)
BUN: 6 mg/dL (ref 6–20)
CHLORIDE: 108 mmol/L (ref 101–111)
CO2: 20 mmol/L — AB (ref 22–32)
CREATININE: 0.71 mg/dL (ref 0.44–1.00)
Calcium: 9.1 mg/dL (ref 8.9–10.3)
GFR calc non Af Amer: >60 mL/min (ref >60)
Glucose, Bld: 114 mg/dL — ABNORMAL HIGH (ref 65–99)
Potassium: 3.8 mmol/L (ref 3.5–5.1)
Sodium: 136 mmol/L (ref 135–145)

## 2017-10-30 LAB — RPR: RPR Ser Ql: NONREACTIVE

## 2017-10-30 MED ORDER — MUPIROCIN CALCIUM 2 % EX CREA
TOPICAL_CREAM | Freq: Once | CUTANEOUS | Status: AC
  Administered 2017-10-30: 01:00:00 via TOPICAL
  Filled 2017-10-30: qty 15

## 2017-10-30 MED ORDER — MUPIROCIN CALCIUM 2 % EX CREA: 1.0000 "application " | TOPICAL_CREAM | Freq: Two times a day (BID) | CUTANEOUS | 0 refills | Status: DC

## 2017-11-25 ENCOUNTER — Emergency Department (HOSPITAL_COMMUNITY)
Admission: EM | Admit: 2017-11-25 | Discharge: 2017-11-25 | Disposition: A | Discharge status: Home / Self Care | Payer: Medicaid Other | Attending: Emergency Medicine | Admitting: Emergency Medicine

## 2017-11-25 ENCOUNTER — Encounter (HOSPITAL_COMMUNITY): Payer: Self-pay | Admitting: Emergency Medicine

## 2017-11-25 ENCOUNTER — Other Ambulatory Visit: Payer: Self-pay

## 2017-11-25 DIAGNOSIS — R112 Nausea with vomiting, unspecified: Secondary | ICD-10-CM | POA: Insufficient documentation

## 2017-11-25 DIAGNOSIS — M7918 Myalgia, other site: Secondary | ICD-10-CM | POA: Insufficient documentation

## 2017-11-25 DIAGNOSIS — R6889 Other general symptoms and signs: Secondary | ICD-10-CM

## 2017-11-25 DIAGNOSIS — R0981 Nasal congestion: Secondary | ICD-10-CM | POA: Insufficient documentation

## 2017-11-25 DIAGNOSIS — R05 Cough: Secondary | ICD-10-CM | POA: Insufficient documentation

## 2017-11-25 DIAGNOSIS — R51 Headache: Secondary | ICD-10-CM | POA: Insufficient documentation

## 2017-11-25 DIAGNOSIS — R509 Fever, unspecified: Secondary | ICD-10-CM | POA: Insufficient documentation

## 2017-11-25 DIAGNOSIS — F17210 Nicotine dependence, cigarettes, uncomplicated: Secondary | ICD-10-CM | POA: Insufficient documentation

## 2017-11-25 LAB — POC URINE PREG, ED
Preg Test, Ur: NEGATIVE
Preg Test, Ur: NEGATIVE

## 2017-11-25 MED ORDER — ACETAMINOPHEN 325 MG PO TABS
650.0000 mg | ORAL_TABLET | Freq: Once | ORAL | Status: AC | PRN
  Administered 2017-11-25: 650 mg via ORAL
  Filled 2017-11-25: qty 2

## 2017-11-25 MED ORDER — IBUPROFEN 400 MG PO TABS: 400.0000 mg | ORAL_TABLET | Freq: Four times a day (QID) | ORAL | 0 refills | Status: DC | PRN

## 2017-11-25 MED ORDER — IBUPROFEN 200 MG PO TABS
600.0000 mg | ORAL_TABLET | Freq: Once | ORAL | Status: AC
  Administered 2017-11-25: 600 mg via ORAL
  Filled 2017-11-25: qty 3

## 2017-11-25 MED ORDER — ACETAMINOPHEN 325 MG PO TABS: 650.0000 mg | ORAL_TABLET | Freq: Four times a day (QID) | ORAL | 0 refills | Status: DC | PRN

## 2017-11-25 MED ORDER — OSELTAMIVIR PHOSPHATE 75 MG PO CAPS: 75.0000 mg | ORAL_CAPSULE | Freq: Two times a day (BID) | ORAL | 0 refills | Status: DC

## 2017-11-25 MED ORDER — ACETAMINOPHEN 325 MG PO TABS: 650.0000 mg | ORAL_TABLET | Freq: Once | ORAL | Status: DC

## 2017-11-25 NOTE — ED Provider Notes (Addendum)
[facility]-EMERGENCY DEPT Provider Note   CSN: [id number] Arrival date & time: 11/25/17  1319     History   Chief Complaint Chief Complaint  Patient presents with  . flu like symptoms    HPI Claudia Knapp is a 22 y.o. female.  HPI    Pt is a 22 y/o female who presents to the ED c/o a cough, body aches, fevers, nausea, and vomiting that began 2-3 days ago but worsened last night. Cough is productive with white/yellow sputum. No hemoptysis. Has had 3 episodes of vomiting. No hematemesis. She reports midsternal chest pain that is present when she coughs. Not present when she doesn't cough.   She also reports a frontal headache, rates it 7/10. She denies shortness of breath. Denies vision changes, or dizziness. Denies worst headache of life. Feels similar to normal migraine, but not as bad.  No neck pain or neck stiffness.  No abdominal pain. No sore throat, ear pain, eye pain.   Past Medical History:  Diagnosis Date  . Anemia   . WUJWJXBJ(478.2)     Patient Active Problem List   Diagnosis Date Noted  . Migraine without aura, without mention of intractable migraine without mention of status migrainosus 03/21/2013  . Tension headache 03/21/2013    History reviewed. No pertinent surgical history.  OB History    No data available       Home Medications    Prior to Admission medications   Medication Sig Start Date End Date Taking? Authorizing Provider  mupirocin cream (BACTROBAN) 2 % Apply 1 application topically 2 (two) times daily. Apply to right middle finger 10/30/17  Yes Pricilla Loveless, MD  traMADol (ULTRAM) 50 MG tablet Take 1 tablet (50 mg total) by mouth every 6 (six) hours as needed. 09/01/17  Yes Audry Pili, PA-C  acetaminophen (TYLENOL) 325 MG tablet Take 2 tablets (650 mg total) by mouth every 6 (six) hours as needed. Do not take more than 4000mg  of tylenol per day 11/25/17   Joab Carden S, PA-C  ibuprofen (ADVIL,MOTRIN) 400 MG tablet  Take 1 tablet (400 mg total) by mouth every 6 (six) hours as needed. 11/25/17   Edwardine Deschepper S, PA-C  oseltamivir (TAMIFLU) 75 MG capsule Take 1 capsule (75 mg total) by mouth every 12 (twelve) hours. 11/25/17   Rhian Funari S, PA-C    Family History Family History  Problem Relation Age of Onset  . Lung cancer Maternal Grandfather   . Stroke Paternal Grandfather   . Bone cancer Maternal Grandmother     Social History Social History   Tobacco Use  . Smoking status: Current Every Day Smoker    Packs/day: 0.00    Types: Cigars  . Smokeless tobacco: Never Used  Substance Use Topics  . Alcohol use: Yes    Comment: Twice a month.   . Drug use: Yes    Frequency: 3.0 times per week    Types: Marijuana     Allergies   Maxalt-mlt [rizatriptan benzoate]   Review of Systems Review of Systems  Constitutional: Positive for appetite change, chills and fever.  HENT: Positive for congestion and rhinorrhea. Negative for ear pain and sore throat.   Eyes: Negative for pain and visual disturbance.  Respiratory: Positive for cough. Negative for shortness of breath and wheezing.   Cardiovascular: Positive for chest pain (with cough). Negative for leg swelling.  Gastrointestinal: Positive for nausea and vomiting. Negative for abdominal pain, constipation and diarrhea.  Genitourinary: Negative for dysuria  and hematuria.  Musculoskeletal: Positive for myalgias. Negative for neck pain and neck stiffness.  Skin: Negative for color change and rash.  Neurological: Positive for weakness (generalized) and headaches. Negative for dizziness.  All other systems reviewed and are negative.    Physical Exam Updated Vital Signs BP 112/81 (BP Location: Right Arm)   Pulse 99   Temp 99.1 F (37.3 C) (Oral)   Resp 20   Ht 5\' 1"  (1.549 m)   Wt 63.5 kg (140 lb)   SpO2 100%   BMI 26.45 kg/m   Physical Exam  Constitutional: She is oriented to person, place, and time. She appears well-developed  and well-nourished. No distress.  HENT:  Head: Normocephalic and atraumatic.  Right Ear: External ear normal.  Left Ear: External ear normal.  Bilateral TMs normal.  Mucous membranes moist.  No pharyngeal erythema.  1+ tonsillar swelling bilaterally with no exudates.  No tonsillar kissing.  No evidence of PTA or retropharyngeal abscess.  Normal voice.  Nose normal.  Eyes: Conjunctivae and EOM are normal. Pupils are equal, round, and reactive to light.  Neck: Normal range of motion. Neck supple.  No nuchal rigidity or cervical adenopathy  Cardiovascular: Normal rate, regular rhythm, normal heart sounds and intact distal pulses.  No murmur heard. Pulmonary/Chest: Effort normal and breath sounds normal. No stridor. No respiratory distress. She has no wheezes. She has no rales. She exhibits tenderness (midsternal, reproduces pain).  Abdominal: Soft. Bowel sounds are normal. She exhibits no distension. There is no tenderness. There is no guarding.  Musculoskeletal: Normal range of motion. She exhibits no edema.  Neurological: She is alert and oriented to person, place, and time. No cranial nerve deficit.  5/5 strength to BUE and BLE  Skin: Skin is warm and dry. Capillary refill takes less than 2 seconds.  Psychiatric: She has a normal mood and affect.  Nursing note and vitals reviewed.    ED Treatments / Results  Labs (all labs ordered are listed, but only abnormal results are displayed) Labs Reviewed  POC URINE PREG, ED  POC URINE PREG, ED    EKG  EKG Interpretation None       Radiology No results found.  Procedures Procedures (including critical care time)  Medications Ordered in ED Medications  acetaminophen (TYLENOL) tablet 650 mg (650 mg Oral Given 11/25/17 1529)  ibuprofen (ADVIL,MOTRIN) tablet 600 mg (600 mg Oral Given 11/25/17 1529)     Initial Impression / Assessment and Plan / ED Course  I have reviewed the triage vital signs and the nursing notes.  Pertinent  labs & imaging results that were available during my care of the patient were reviewed by me and considered in my medical decision making (see chart for details).    Final Clinical Impressions(s) / ED Diagnoses   Final diagnoses:  Flu-like symptoms    Patient with symptoms consistent with influenza.  Mildly tachycardic with fever to 102.3.  Tachycardia likely related to increased temp and dehydration. normal blood pressure respiration and O2 sats. Vital signs improved after tylenol and ibuprofen. Patient nontoxic appearing in no acute distress.  No signs of dehydration, tolerating PO's.  Lungs are clear. Due to patient's presentation and physical exam a chest x-ray was not ordered bc likely diagnosis of flu.  Will send patient home with Tamiflu.  Discussed possible side effects.  Patient will be discharged with instructions to orally hydrate, rest, and use over-the-counter medications such as anti-inflammatories ibuprofen and Aleve for muscle aches and Tylenol for  fever.  Return precautions given. Provided opportunity for questions and answered all questions.   ED Discharge Orders        Ordered    acetaminophen (TYLENOL) 325 MG tablet  Every 6 hours PRN     11/25/17 1527    ibuprofen (ADVIL,MOTRIN) 400 MG tablet  Every 6 hours PRN     11/25/17 1527    oseltamivir (TAMIFLU) 75 MG capsule  Every 12 hours     11/25/17 1527       Benjimen Kelley S, PA-C 11/25/17 2328    Karrie Meres, PA-C 11/25/17 2329    Benjiman Core, MD 11/27/17 [phone]

## 2017-11-25 NOTE — Discharge Instructions (Signed)
You are given a prescription for Tamiflu.  Be aware that this medication may make you have nausea, vomiting, abdominal pain, or diarrhea.  Please make sure to take stay hydrated while you are taking this medication.  Rotate Tylenol and ibuprofen for fevers.  You should follow up with your primary healthcare provider within the next 5-7 days for reevaluation.  You will need to return to the emergency department immediately if you experience any of the following symptoms:  Difficulty breathing or shortness or breath Pain or pressure in the chest or abdomen Sudden dizziness Confusion Severe or persistent vomiting Flu-like symptoms that improve but then return with fever or worse cough  You should stay home for at least 24 hours after your fever is gone except to get medical care or other necessities. Your fever should be gone without the need to use a fever-reducing medicine, such as Tylenol or Motrin. Until then, you should stay home from work, school, travel, shopping, social events, and public gatherings.  Stay away from others as much as possible to keep from infecting them. If you must leave home, for example to get medical care, wear a facemask if you have one, or cover coughs and sneezes with a tissue. Wash your hands often to keep from spreading flu to others

## 2017-11-25 NOTE — ED Triage Notes (Signed)
Per pt, states body aches, chill and fever since last night-states she vomitied last night nd this am but is now able to hold fluids down

## 2018-04-15 ENCOUNTER — Other Ambulatory Visit: Payer: Self-pay

## 2018-04-15 ENCOUNTER — Emergency Department (HOSPITAL_BASED_OUTPATIENT_CLINIC_OR_DEPARTMENT_OTHER)
Admission: EM | Admit: 2018-04-15 | Discharge: 2018-04-15 | Disposition: A | Discharge status: Home / Self Care | Payer: Medicaid Other | Attending: Emergency Medicine | Admitting: Emergency Medicine

## 2018-04-15 ENCOUNTER — Encounter (HOSPITAL_BASED_OUTPATIENT_CLINIC_OR_DEPARTMENT_OTHER): Payer: Self-pay | Admitting: Emergency Medicine

## 2018-04-15 ENCOUNTER — Emergency Department (HOSPITAL_BASED_OUTPATIENT_CLINIC_OR_DEPARTMENT_OTHER): Payer: Medicaid Other

## 2018-04-15 DIAGNOSIS — J181 Lobar pneumonia, unspecified organism: Secondary | ICD-10-CM | POA: Insufficient documentation

## 2018-04-15 DIAGNOSIS — J189 Pneumonia, unspecified organism: Secondary | ICD-10-CM

## 2018-04-15 DIAGNOSIS — F17290 Nicotine dependence, other tobacco product, uncomplicated: Secondary | ICD-10-CM | POA: Insufficient documentation

## 2018-04-15 DIAGNOSIS — J039 Acute tonsillitis, unspecified: Secondary | ICD-10-CM

## 2018-04-15 LAB — RAPID STREP SCREEN (MED CTR MEBANE ONLY): STREPTOCOCCUS, GROUP A SCREEN (DIRECT): NEGATIVE

## 2018-04-15 MED ORDER — ACETAMINOPHEN 160 MG/5ML PO SOLN
ORAL | Status: AC
  Filled 2018-04-15: qty 20.3

## 2018-04-15 MED ORDER — AZITHROMYCIN 200 MG/5ML PO SUSR
500.0000 mg | Freq: Once | ORAL | Status: DC
  Filled 2018-04-15: qty 12.5

## 2018-04-15 MED ORDER — ACETAMINOPHEN 160 MG/5ML PO SOLN
650.0000 mg | Freq: Once | ORAL | Status: AC
  Administered 2018-04-15: 650 mg via ORAL

## 2018-04-15 MED ORDER — AZITHROMYCIN 200 MG/5ML PO SUSR: ORAL | 0 refills | Status: DC

## 2018-04-15 MED ORDER — ACETAMINOPHEN 325 MG PO TABS
650.0000 mg | ORAL_TABLET | Freq: Once | ORAL | Status: DC | PRN
  Filled 2018-04-15: qty 2

## 2018-04-15 MED ORDER — IBUPROFEN 100 MG/5ML PO SUSP
600.0000 mg | Freq: Once | ORAL | Status: AC
  Administered 2018-04-15: 600 mg via ORAL
  Filled 2018-04-15: qty 30

## 2018-04-15 MED ORDER — DEXAMETHASONE 10 MG/ML FOR PEDIATRIC ORAL USE
INTRAMUSCULAR | Status: AC
  Administered 2018-04-15: 10 mg
  Filled 2018-04-15: qty 1

## 2018-04-15 MED ORDER — DEXAMETHASONE 1 MG/ML PO CONC
10.0000 mg | Freq: Once | ORAL | Status: DC
  Filled 2018-04-15: qty 10

## 2018-04-15 MED ORDER — IBUPROFEN 600 MG PO TABS: 600.0000 mg | ORAL_TABLET | Freq: Four times a day (QID) | ORAL | 0 refills | Status: DC | PRN

## 2018-04-15 MED FILL — AZITHROMYCIN 250 MG TABLET: 250 | 5 days supply | Qty: 6 | Fill #0

## 2018-04-15 MED FILL — IBUPROFEN 600 MG TABLET: 600 | 7 days supply | Qty: 30 | Fill #0

## 2018-04-15 NOTE — ED Triage Notes (Signed)
Fever, cough, sore throat x 3 days.

## 2018-04-15 NOTE — ED Provider Notes (Signed)
[facility] EMERGENCY DEPARTMENT Provider Note   CSN: [id number] Arrival date & time: 04/15/18  1040     History   Chief Complaint Chief Complaint  Patient presents with  . Fever    HPI Claudia Knapp is a 22 y.o. female.  HPI Claudia Knapp is a 22 y.o. female with history of anemia, migraines, presents to emergency department complaining of fever, chills, sore throat, cough, generalized malaise.  Patient symptoms have been there for 3 days.  She states that she has been coughing up thick mucus.  She states that she is having difficulty swallowing solids but still able to swallow liquids.  She denies any sick contacts.  She has not been taking any medications prior to coming in.  States she has been sleeping most of the time over the last few days.  She does not think she is pregnant.  She denies any urinary symptoms.  She denies any abdominal pain or back pain.  No neck pain or stiffness.  Past Medical History:  Diagnosis Date  . Anemia   . WUJWJXBJ(478.2)     Patient Active Problem List   Diagnosis Date Noted  . Migraine without aura, without mention of intractable migraine without mention of status migrainosus 03/21/2013  . Tension headache 03/21/2013    History reviewed. No pertinent surgical history.   OB History   None      Home Medications    Prior to Admission medications   Medication Sig Start Date End Date Taking? Authorizing Provider  acetaminophen (TYLENOL) 325 MG tablet Take 2 tablets (650 mg total) by mouth every 6 (six) hours as needed. Do not take more than 4000mg  of tylenol per day 11/25/17   Couture, Cortni S, PA-C  ibuprofen (ADVIL,MOTRIN) 400 MG tablet Take 1 tablet (400 mg total) by mouth every 6 (six) hours as needed. 11/25/17   Couture, Cortni S, PA-C  mupirocin cream (BACTROBAN) 2 % Apply 1 application topically 2 (two) times daily. Apply to right middle finger 10/30/17   Pricilla Loveless, MD  oseltamivir (TAMIFLU) 75 MG capsule Take 1  capsule (75 mg total) by mouth every 12 (twelve) hours. 11/25/17   Couture, Cortni S, PA-C  traMADol (ULTRAM) 50 MG tablet Take 1 tablet (50 mg total) by mouth every 6 (six) hours as needed. 09/01/17   Audry Pili, PA-C    Family History Family History  Problem Relation Age of Onset  . Lung cancer Maternal Grandfather   . Stroke Paternal Grandfather   . Bone cancer Maternal Grandmother     Social History Social History   Tobacco Use  . Smoking status: Current Every Day Smoker    Packs/day: 0.00    Types: Cigars  . Smokeless tobacco: Never Used  Substance Use Topics  . Alcohol use: Yes    Comment: Twice a month.   . Drug use: Yes    Frequency: 3.0 times per week    Types: Marijuana     Allergies   Maxalt-mlt [rizatriptan benzoate]   Review of Systems Review of Systems  Constitutional: Positive for chills, fatigue and fever.  HENT: Positive for sore throat and trouble swallowing. Negative for congestion and ear pain.   Respiratory: Positive for cough and shortness of breath. Negative for chest tightness.   Cardiovascular: Negative for chest pain, palpitations and leg swelling.  Gastrointestinal: Negative for abdominal pain, diarrhea, nausea and vomiting.  Genitourinary: Negative for dysuria, flank pain, pelvic pain, vaginal bleeding, vaginal discharge and vaginal pain.  Musculoskeletal: Positive  for myalgias. Negative for arthralgias, neck pain and neck stiffness.  Skin: Negative for rash.  Neurological: Positive for weakness. Negative for dizziness and headaches.  All other systems reviewed and are negative.    Physical Exam Updated Vital Signs BP 120/70   Pulse (!) 109   Temp (!) 102.8 F (39.3 C) (Oral)   Resp 20   Ht 5\' 1"  (1.549 m)   LMP 04/12/2018   SpO2 100%   BMI 26.45 kg/m   Physical Exam  Constitutional: She is oriented to person, place, and time. She appears well-developed and well-nourished. No distress.  HENT:  Head: Normocephalic.  Tonsils  are enlarged bilaterally, uvula midline, exudate present.  Normal TMs bilaterally.  Normal nose  Eyes: Conjunctivae are normal.  Neck: Normal range of motion. Neck supple.  Cardiovascular: Normal rate, regular rhythm and normal heart sounds.  Pulmonary/Chest: Effort normal and breath sounds normal. No respiratory distress. She has no wheezes. She has no rales.  Abdominal: Soft. Bowel sounds are normal. She exhibits no distension. There is no tenderness. There is no rebound.  Musculoskeletal: She exhibits no edema.  Neurological: She is alert and oriented to person, place, and time.  Skin: Skin is warm and dry.  Psychiatric: She has a normal mood and affect. Her behavior is normal.  Nursing note and vitals reviewed.    ED Treatments / Results  Labs (all labs ordered are listed, but only abnormal results are displayed) Labs Reviewed  RAPID STREP SCREEN [facility])  CULTURE, GROUP A STREP [facility])    EKG None  Radiology Dg Chest 2 View  Result Date: 04/15/2018 CLINICAL DATA:  Cough and fever congestion EXAM: CHEST - 2 VIEW COMPARISON:  None. FINDINGS: Mild airspace disease in the lingula. Given the history, this is likely pneumonia. Right lung clear. No effusion. Heart size within normal limits. IMPRESSION: Lingular infiltrate consistent with pneumonia. Electronically Signed   By: Marlan Palauharles  Clark M.D.   On: 04/15/2018 11:09    Procedures Procedures (including critical care time)  Medications Ordered in ED Medications  acetaminophen (TYLENOL) tablet 650 mg (650 mg Oral Refused 04/15/18 1054)  dexamethasone (DECADRON) 1 MG/ML solution 10 mg (has no administration in time range)  azithromycin (ZITHROMAX) 200 MG/5ML suspension 500 mg (has no administration in time range)  acetaminophen (TYLENOL) solution 650 mg (650 mg Oral Given 04/15/18 1057)     Initial Impression / Assessment and Plan / ED Course  I have reviewed the triage vital signs and the nursing notes.  Pertinent labs  & imaging results that were available during my care of the patient were reviewed by me and considered in my medical decision making (see chart for details).  Clinical Course as of Apr 16 1215  Thu Apr 15, 2018  1155 DG Chest 2 View [RH]  1156 DG Chest 2 View [RH]    Clinical Course User Index [RH] Julieanne MansonHaug, Rebecca, Wisconsintudent-PA    Pt in emergency department with sore throat, fever, chills, cough.  Patient appears to be uncomfortable, fever of 102.8, mildly tachycardic.  Received Tylenol in triage.  Lungs clear, abdomen benign.  No vomiting or diarrhea.  Will get rapid strep as well as chest x-ray and recheck her vital signs.   Chest x-ray showing possible pneumonia in lingula.  Rapid strep is negative.  Patient received Decadron for swelling and inflammation.  Her temperature is down to 100.9, heart rate improved, currently in the 90s.  Blood pressure, oxygen saturation, respiratory rate remains normal.  No concern for sepsis.  Patient is drinking water in department and ambulatory.  Requesting liquid antibiotic.  Will start on Zithromax, which will cover for potential strep as well as pneumonia.  Will give ibuprofen to take at home.  Additionally instructed to take Tylenol for her fever.  Vitals:   04/15/18 1046 04/15/18 1222  BP: 120/70 109/62  Pulse: (!) 109 95  Resp: 20 20  Temp: (!) 102.8 F (39.3 C) (!) 100.9 F (38.3 C)  TempSrc: Oral Oral  SpO2: 100% 100%  Height: 5\' 1"  (1.549 m)     Final Clinical Impressions(s) / ED Diagnoses   Final diagnoses:  Community acquired pneumonia of left lower lobe of lung (HCC)  Tonsillitis    ED Discharge Orders        Ordered    azithromycin (ZITHROMAX) 200 MG/5ML suspension     04/15/18 1310    ibuprofen (ADVIL,MOTRIN) 600 MG tablet  Every 6 hours PRN     04/15/18 1310       Trellis Moment [place], PA-C 04/15/18 1926    Tilden Fossa, MD 04/16/18 [phone]

## 2018-04-15 NOTE — Discharge Instructions (Addendum)
Take zithromax as prescribed until all gone.  Drink plenty of fluids.  Take Tylenol and Motrin around-the-clock to help to reduce your fever.  Follow-up with family doctor.  Return if worsening

## 2018-04-16 ENCOUNTER — Emergency Department (HOSPITAL_COMMUNITY): Payer: Medicaid Other

## 2018-04-16 ENCOUNTER — Emergency Department (HOSPITAL_COMMUNITY)
Admission: EM | Admit: 2018-04-16 | Discharge: 2018-04-17 | Disposition: A | Discharge status: Home / Self Care | Payer: Medicaid Other | Attending: Emergency Medicine | Admitting: Emergency Medicine

## 2018-04-16 ENCOUNTER — Encounter (HOSPITAL_COMMUNITY): Payer: Self-pay | Admitting: *Deleted

## 2018-04-16 DIAGNOSIS — R05 Cough: Secondary | ICD-10-CM | POA: Insufficient documentation

## 2018-04-16 DIAGNOSIS — F17210 Nicotine dependence, cigarettes, uncomplicated: Secondary | ICD-10-CM | POA: Insufficient documentation

## 2018-04-16 DIAGNOSIS — R509 Fever, unspecified: Secondary | ICD-10-CM | POA: Insufficient documentation

## 2018-04-16 DIAGNOSIS — R0602 Shortness of breath: Secondary | ICD-10-CM | POA: Insufficient documentation

## 2018-04-16 DIAGNOSIS — R0789 Other chest pain: Secondary | ICD-10-CM

## 2018-04-16 DIAGNOSIS — Z79899 Other long term (current) drug therapy: Secondary | ICD-10-CM | POA: Insufficient documentation

## 2018-04-16 LAB — CBC WITH DIFFERENTIAL/PLATELET
Basophils Absolute: 0 10*3/uL (ref 0.0–0.1)
Basophils Relative: 0 %
EOS ABS: 0 10*3/uL (ref 0.0–0.7)
Eosinophils Relative: 0 %
HEMATOCRIT: 32.9 % — AB (ref 36.0–46.0)
HEMOGLOBIN: 10.6 g/dL — AB (ref 12.0–15.0)
Lymphocytes Relative: 14 %
Lymphs Abs: 1.2 10*3/uL (ref 0.7–4.0)
MCH: 22.9 pg — ABNORMAL LOW (ref 26.0–34.0)
MCHC: 32.2 g/dL (ref 30.0–36.0)
MCV: 71.2 fL — ABNORMAL LOW (ref 78.0–100.0)
MONOS PCT: 10 %
Monocytes Absolute: 0.9 10*3/uL (ref 0.1–1.0)
NEUTROS ABS: 6.5 10*3/uL (ref 1.7–7.7)
NEUTROS PCT: 76 %
Platelets: 286 10*3/uL (ref 150–400)
RBC: 4.62 MIL/uL (ref 3.87–5.11)
RDW: 19.4 % — ABNORMAL HIGH (ref 11.5–15.5)
WBC: 8.5 10*3/uL (ref 4.0–10.5)

## 2018-04-16 LAB — I-STAT CHEM 8, ED
BUN: 9 mg/dL (ref 6–20)
CHLORIDE: 106 mmol/L (ref 98–111)
CREATININE: 0.6 mg/dL (ref 0.44–1.00)
Calcium, Ion: 1.13 mmol/L — ABNORMAL LOW (ref 1.15–1.40)
Glucose, Bld: 89 mg/dL (ref 70–99)
HEMATOCRIT: 33 % — AB (ref 36.0–46.0)
Hemoglobin: 11.2 g/dL — ABNORMAL LOW (ref 12.0–15.0)
Potassium: 3.2 mmol/L — ABNORMAL LOW (ref 3.5–5.1)
SODIUM: 141 mmol/L (ref 135–145)
TCO2: 23 mmol/L (ref 22–32)

## 2018-04-16 LAB — I-STAT CG4 LACTIC ACID, ED: Lactic Acid, Venous: 1.75 mmol/L (ref 0.5–1.9)

## 2018-04-16 LAB — D-DIMER, QUANTITATIVE: D-Dimer, Quant: 1.18 ug/mL-FEU — ABNORMAL HIGH (ref 0.00–0.50)

## 2018-04-16 MED ORDER — PROMETHAZINE HCL 25 MG/ML IJ SOLN
25.0000 mg | Freq: Once | INTRAMUSCULAR | Status: AC
  Administered 2018-04-16: 25 mg via INTRAVENOUS
  Filled 2018-04-16: qty 1

## 2018-04-16 MED ORDER — IOPAMIDOL (ISOVUE-370) INJECTION 76%
80.0000 mL | Freq: Once | INTRAVENOUS | Status: AC | PRN
  Administered 2018-04-16: 80 mL via INTRAVENOUS

## 2018-04-16 MED ORDER — MORPHINE SULFATE (PF) 4 MG/ML IV SOLN
4.0000 mg | Freq: Once | INTRAVENOUS | Status: AC
  Administered 2018-04-16: 4 mg via INTRAVENOUS
  Filled 2018-04-16: qty 1

## 2018-04-16 MED ORDER — SODIUM CHLORIDE 0.9 % IV BOLUS
1000.0000 mL | Freq: Once | INTRAVENOUS | Status: AC
  Administered 2018-04-16: 1000 mL via INTRAVENOUS

## 2018-04-16 MED ORDER — IOPAMIDOL (ISOVUE-370) INJECTION 76%
INTRAVENOUS | Status: AC
  Filled 2018-04-16: qty 100

## 2018-04-16 NOTE — ED Notes (Signed)
Claudia CopasLakeshia Knapp (mom): [phone]  Please call mom if pt is discharged, mom will come to pick pt up at discharge.

## 2018-04-16 NOTE — ED Provider Notes (Signed)
Patient signed out to me from Fayrene HelperBowie Tran, PA-C at end of shift. Patient here for second time in 2 days for cough, chest pain, SOB. Dx yesterday with pna, started on z-pack. Here today because she feels no better. Tachycardic, c/o CP, SOB - PE to be ruled out.  Pending CTA for elevated d-dimer Nausea and vomiting - ? zithromax  CTA negative for PE. Recheck of the patient finds her sleeping. She is given a drink and is tolerating PO. VSS. She is felt appropriate for discharge home.    Elpidio AnisUpstill, Jaquitta Dupriest, PA-C 04/17/18 0044    Mancel BaleWentz, Elliott, MD 04/18/18 [phone]

## 2018-04-16 NOTE — ED Provider Notes (Signed)
[facility]-EMERGENCY DEPT Provider Note   CSN: [id number] Arrival date & time: 04/16/18  1703     History   Chief Complaint Chief Complaint  Patient presents with  . Shortness of Breath  . Chest Pain    HPI Claudia Knapp is a 22 y.o. female.  The history is provided by the patient and a significant other. No language interpreter was used.  Shortness of Breath  Associated symptoms include chest pain.  Chest Pain   Associated symptoms include shortness of breath.     22 year old female presenting for evaluation of chest pain.  Patient report for the past 4 days she has had chest pain shortness of breath productive cough and subjective fever.  She was seen yesterday for symptom and was subsequently diagnosed with pneumonia.  She was prescribed Zithromax and ibuprofen.  She returns today due to worsening of symptoms.  She has had persistent nausea, vomiting, feeling awful, with pain in the chest.  Symptoms started after taking her medication.  No constipation or diarrhea.  No abdominal pain.  No prior history of PE or DVT, no recent surgery, prolonged bedrest, active cancer, hemoptysis, using oral hormone.  Her pregnancy status from yesterday was negative.  Past Medical History:  Diagnosis Date  . Anemia   . WUJWJXBJ(478.2)     Patient Active Problem List   Diagnosis Date Noted  . Migraine without aura, without mention of intractable migraine without mention of status migrainosus 03/21/2013  . Tension headache 03/21/2013    History reviewed. No pertinent surgical history.   OB History   None      Home Medications    Prior to Admission medications   Medication Sig Start Date End Date Taking? Authorizing Provider  acetaminophen (TYLENOL) 325 MG tablet Take 2 tablets (650 mg total) by mouth every 6 (six) hours as needed. Do not take more than 4000mg  of tylenol per day 11/25/17   Couture, Cortni S, PA-C  azithromycin (ZITHROMAX) 200 MG/5ML  suspension Take 500mg  PO day 1, take 250mg  day 2-6 04/15/18   Kirichenko, Tatyana, PA-C  ibuprofen (ADVIL,MOTRIN) 600 MG tablet Take 1 tablet (600 mg total) by mouth every 6 (six) hours as needed. 04/15/18   Kirichenko, Lemont Fillers, PA-C  mupirocin cream (BACTROBAN) 2 % Apply 1 application topically 2 (two) times daily. Apply to right middle finger 10/30/17   Pricilla Loveless, MD  oseltamivir (TAMIFLU) 75 MG capsule Take 1 capsule (75 mg total) by mouth every 12 (twelve) hours. 11/25/17   Couture, Cortni S, PA-C  traMADol (ULTRAM) 50 MG tablet Take 1 tablet (50 mg total) by mouth every 6 (six) hours as needed. 09/01/17   Audry Pili, PA-C    Family History Family History  Problem Relation Age of Onset  . Lung cancer Maternal Grandfather   . Stroke Paternal Grandfather   . Bone cancer Maternal Grandmother     Social History Social History   Tobacco Use  . Smoking status: Current Every Day Smoker    Packs/day: 0.00    Types: Cigars  . Smokeless tobacco: Never Used  Substance Use Topics  . Alcohol use: Yes    Comment: Twice a month.   . Drug use: Yes    Frequency: 3.0 times per week    Types: Marijuana     Allergies   Maxalt-mlt [rizatriptan benzoate]   Review of Systems Review of Systems  Respiratory: Positive for shortness of breath.   Cardiovascular: Positive for chest pain.     Physical  Exam Updated Vital Signs BP 115/85 (BP Location: Left Arm)   Pulse (!) 111   Temp 98.8 F (37.1 C) (Oral)   LMP 04/12/2018   SpO2 97%   Physical Exam  Constitutional: She appears well-developed and well-nourished. She appears distressed (Actively vomiting, appears very uncomfortable, leaning over the bed.).  HENT:  Head: Atraumatic.  Eyes: Conjunctivae are normal.  Neck: Neck supple.  Cardiovascular:  Tachycardia without murmur rubs or gallops  Pulmonary/Chest:  No obvious wheeze, rales, rhonchi heard  Musculoskeletal:       Right lower leg: She exhibits no edema.       Left  lower leg: She exhibits no edema.  Neurological: She is alert.  Skin: No rash noted.  Psychiatric: She has a normal mood and affect.  Nursing note and vitals reviewed.    ED Treatments / Results  Labs (all labs ordered are listed, but only abnormal results are displayed) Labs Reviewed  CBC WITH DIFFERENTIAL/PLATELET - Abnormal; Notable for the following components:      Result Value   Hemoglobin 10.6 (*)    HCT 32.9 (*)    MCV 71.2 (*)    MCH 22.9 (*)    RDW 19.4 (*)    All other components within normal limits  D-DIMER, QUANTITATIVE (NOT AT [facility]) - Abnormal; Notable for the following components:   D-Dimer, Quant 1.18 (*)    All other components within normal limits  I-STAT CHEM 8, ED - Abnormal; Notable for the following components:   Potassium 3.2 (*)    Calcium, Ion 1.13 (*)    Hemoglobin 11.2 (*)    HCT 33.0 (*)    All other components within normal limits  I-STAT CG4 LACTIC ACID, ED    EKG EKG Interpretation  Date/Time:  Friday April 16 2018 19:09:54 EDT Ventricular Rate:  115 PR Interval:    QRS Duration: 78 QT Interval:  292 QTC Calculation: 404 R Axis:   68 Text Interpretation:  Sinus tachycardia Left atrial enlargement Borderline repolarization abnormality No old tracing to compare Confirmed by Mancel Bale [phone]) on 04/16/2018 10:43:38 PM   Radiology Ct Angio Chest Pe W And/or Wo Contrast  Result Date: 04/17/2018 CLINICAL DATA:  Chest and back pain for 1 week. Shortness of breath. Diagnosed with pneumonia yesterday, feeling worse today. EXAM: CT ANGIOGRAPHY CHEST WITH CONTRAST TECHNIQUE: Multidetector CT imaging of the chest was performed using the standard protocol during bolus administration of intravenous contrast. Multiplanar CT image reconstructions and MIPs were obtained to evaluate the vascular anatomy. CONTRAST:  80mL ISOVUE-370 IOPAMIDOL (ISOVUE-370) INJECTION 76% COMPARISON:  Radiographs 04/15/2018. FINDINGS: Cardiovascular: The pulmonary arteries  are well opacified with contrast to the level of the subsegmental branches. There is no evidence of acute pulmonary embolism. The thoracic aorta appears normal. The heart size is normal. There is no pericardial effusion. Mediastinum/Nodes: There are no enlarged mediastinal, hilar or axillary lymph nodes.Small amount of residual thymic tissue in the anterior mediastinum. The thyroid gland, trachea and esophagus demonstrate no significant findings. Lungs/Pleura: There is no pleural effusion. The lungs are clear. No evidence of lingular infiltrate. Upper abdomen:  Unremarkable.  There is no adrenal mass. Musculoskeletal/Chest wall: There is no chest wall mass or suspicious osseous finding. Review of the MIP images confirms the above findings. IMPRESSION: No evidence of acute pulmonary embolism or other acute chest process. The lungs are clear. Electronically Signed   By: Carey Bullocks M.D.   On: 04/17/2018 00:07    Procedures Procedures (including critical  care time)  Medications Ordered in ED Medications  sodium chloride 0.9 % bolus 1,000 mL (0 mLs Intravenous Stopped 04/16/18 2155)  promethazine (PHENERGAN) injection 25 mg (25 mg Intravenous Given 04/16/18 2026)  morphine 4 MG/ML injection 4 mg (4 mg Intravenous Given 04/16/18 2028)  iopamidol (ISOVUE-370) 76 % injection 80 mL (80 mLs Intravenous Contrast Given 04/16/18 2332)     Initial Impression / Assessment and Plan / ED Course  I have reviewed the triage vital signs and the nursing notes.  Pertinent labs & imaging results that were available during my care of the patient were reviewed by me and considered in my medical decision making (see chart for details).     BP 105/75 (BP Location: Right Arm)   Pulse 87   Temp 99.5 F (37.5 C) (Oral)   Resp 20   LMP 04/12/2018   SpO2 99%    Final Clinical Impressions(s) / ED Diagnoses   Final diagnoses:  Chest wall pain    ED Discharge Orders        Ordered    ondansetron (ZOFRAN) 4 MG  tablet  Every 6 hours     04/17/18 0039     7:21 PM Patient was here yesterday for evaluation of cough and chest pain.  Was diagnosed with pneumonia.  Rapid strep test negative.  Patient discharged home with Zithromax and ibuprofen.  She returns again today with worsening of her symptoms and now actively vomiting.  She is found to be tachycardic, appears uncomfortable, and warm to the touch.  [facility] therefore, will obtain d-dimer to rule out potential PE.  symptomatic treatment provided.   Fayrene Helperran, Elazar Argabright, PA-C 04/17/18 1518    Mancel BaleWentz, Elliott, MD 04/18/18 [phone]

## 2018-04-16 NOTE — ED Triage Notes (Signed)
Pt complains of chest pain, shortness of breath for the past 4 days. Pt was seen yesterday and diagnosed with pneumonia. Pt has started on prescribed antibiotics. Pt states she feels worse today.

## 2018-04-17 LAB — CULTURE, GROUP A STREP (THRC)

## 2018-04-17 MED ORDER — ONDANSETRON HCL 4 MG PO TABS: 4.0000 mg | ORAL_TABLET | Freq: Four times a day (QID) | ORAL | 0 refills | Status: DC

## 2018-04-17 NOTE — Discharge Instructions (Addendum)
Continue your antibiotic until it is completed. Take Zofran for nausea. Push fluids. Continue tylenol and/or ibuprofen for any fever or aches.

## 2019-07-09 ENCOUNTER — Other Ambulatory Visit: Payer: Self-pay

## 2019-07-09 ENCOUNTER — Encounter (HOSPITAL_BASED_OUTPATIENT_CLINIC_OR_DEPARTMENT_OTHER): Payer: Self-pay | Admitting: Emergency Medicine

## 2019-07-09 ENCOUNTER — Emergency Department (HOSPITAL_BASED_OUTPATIENT_CLINIC_OR_DEPARTMENT_OTHER)
Admission: EM | Admit: 2019-07-09 | Discharge: 2019-07-09 | Disposition: A | Discharge status: Home / Self Care | Payer: BLUE CROSS/BLUE SHIELD | Attending: Emergency Medicine | Admitting: Emergency Medicine

## 2019-07-09 DIAGNOSIS — L02416 Cutaneous abscess of left lower limb: Secondary | ICD-10-CM | POA: Insufficient documentation

## 2019-07-09 DIAGNOSIS — L0291 Cutaneous abscess, unspecified: Secondary | ICD-10-CM

## 2019-07-09 DIAGNOSIS — F17290 Nicotine dependence, other tobacco product, uncomplicated: Secondary | ICD-10-CM | POA: Insufficient documentation

## 2019-07-09 DIAGNOSIS — R2242 Localized swelling, mass and lump, left lower limb: Secondary | ICD-10-CM | POA: Diagnosis present

## 2019-07-09 MED ORDER — SULFAMETHOXAZOLE-TRIMETHOPRIM 800-160 MG PO TABS: 1.0000 | ORAL_TABLET | Freq: Two times a day (BID) | ORAL | 0 refills | Status: AC

## 2019-07-09 NOTE — ED Provider Notes (Signed)
[facility] EMERGENCY DEPARTMENT Provider Note   CSN: [id number] Arrival date & time: 07/09/19  1559     History   Chief Complaint Chief Complaint  Patient presents with  . Recurrent Skin Infections    left posterior thigh    HPI Claudia Knapp is a 23 y.o. female.     The history is provided by the patient and medical records. No language interpreter was used.   Claudia Knapp is a 23 y.o. female  with a PMH as listed below who presents to the Emergency Department complaining of wound to the left upper thigh.  Patient states that 3 days ago, she noticed a very small bump on her thigh.  She squeezed it, thinking it was a sit, but nothing came out.  She has not messed with it since, but this morning, it became much more swollen and painful.  Denies history of similar.  No medications or treatments prior to arrival for symptoms.  Past Medical History:  Diagnosis Date  . Anemia   . ZJQBHALP(379.0)     Patient Active Problem List   Diagnosis Date Noted  . Migraine without aura, without mention of intractable migraine without mention of status migrainosus 03/21/2013  . Tension headache 03/21/2013    History reviewed. No pertinent surgical history.   OB History   No obstetric history on file.      Home Medications    Prior to Admission medications   Medication Sig Start Date End Date Taking? Authorizing Provider  ibuprofen (ADVIL,MOTRIN) 600 MG tablet Take 1 tablet (600 mg total) by mouth every 6 (six) hours as needed. 04/15/18   Kirichenko, Tatyana, PA-C  sulfamethoxazole-trimethoprim (BACTRIM DS) 800-160 MG tablet Take 1 tablet by mouth 2 (two) times daily for 7 days. 07/09/19 07/16/19  Derryl Uher, Chase Picket, PA-C    Family History Family History  Problem Relation Age of Onset  . Lung cancer Maternal Grandfather   . Stroke Paternal Grandfather   . Bone cancer Maternal Grandmother     Social History Social History   Tobacco Use  . Smoking status:  Current Every Day Smoker    Packs/day: 0.00    Types: Cigars  . Smokeless tobacco: Never Used  Substance Use Topics  . Alcohol use: Yes    Comment: Twice a month.   . Drug use: Yes    Frequency: 3.0 times per week    Types: Marijuana     Allergies   Maxalt-mlt [rizatriptan benzoate]   Review of Systems Review of Systems  Constitutional: Negative for fever.  Skin: Positive for wound.     Physical Exam Updated Vital Signs BP 118/73 (BP Location: Left Arm)   Pulse 99   Temp 98.6 F (37 C) (Oral)   Resp 20   Ht 5\' 1"  (1.549 m)   LMP 07/09/2019   SpO2 100%   BMI 26.45 kg/m   Physical Exam Vitals signs and nursing note reviewed.  Constitutional:      General: She is not in acute distress.    Appearance: She is well-developed.  HENT:     Head: Normocephalic and atraumatic.  Neck:     Musculoskeletal: Neck supple.  Cardiovascular:     Rate and Rhythm: Normal rate and regular rhythm.     Heart sounds: Normal heart sounds. No murmur.  Pulmonary:     Effort: Pulmonary effort is normal. No respiratory distress.     Breath sounds: Normal breath sounds. No wheezing or rales.  Musculoskeletal: Normal range  of motion.  Skin:    General: Skin is warm and dry.     Comments: 2x2 area of swelling to the left thigh concerning for abscess.  Tender to palpation and does have mild surrounding erythema.  No drainage.  Neurological:     Mental Status: She is alert.      ED Treatments / Results  Labs (all labs ordered are listed, but only abnormal results are displayed) Labs Reviewed - No data to display  EKG None  Radiology No results found.  Procedures Procedures (including critical care time)  Medications Ordered in ED Medications - No data to display   Initial Impression / Assessment and Plan / ED Course  I have reviewed the triage vital signs and the nursing notes.  Pertinent labs & imaging results that were available during my care of the patient were  reviewed by me and considered in my medical decision making (see chart for details).        Claudia Knapp is a 23 y.o. female who presents to ED for wound to the left upper thigh concerning for abscess.  Recommended incision and drainage today for this, however patient states that she has a monitor a shower for her bachelorette party tonight at 7 PM.  She does not want to be embarrassed during this with a wound or dressing site, therefore refuses procedure today.  I will start her on antibiotics, but recommended that she come in as soon as possible for I&D.  She tells me that she will do this first thing in the morning.  I stressed the importance of such and informed her that I did not feel antibiotics alone would be enough for complete resolution.  Discussed risks of delaying procedure and importance of returning immediately should infection spread or she develop a fever.  All questions were answered.   Final Clinical Impressions(s) / ED Diagnoses   Final diagnoses:  Abscess    ED Discharge Orders         Ordered    sulfamethoxazole-trimethoprim (BACTRIM DS) 800-160 MG tablet  2 times daily     07/09/19 1652           Loomis Anacker, Ozella Almond, PA-C 07/09/19 1702    Isla Pence, MD 07/09/19 1818

## 2019-07-09 NOTE — ED Triage Notes (Signed)
Pt reports Wednesday noticed a small bump to her posterior thigh. Pt squeezed area causing increase in swelling and pain. Pt unsure if she was bit by an insect. Pt presents with redness and swelling to left thigh.

## 2019-07-09 NOTE — Discharge Instructions (Signed)
Please take all of your antibiotics until finished!  As we discussed, your wound likely needs to be opened up to properly heal.  I recommend that you get this done as soon as possible, but in the meantime, we will start you on some antibiotics.  You can return to the ER at any time.

## 2019-07-09 NOTE — ED Notes (Signed)
ED Provider at bedside. 

## 2019-07-10 ENCOUNTER — Encounter (HOSPITAL_BASED_OUTPATIENT_CLINIC_OR_DEPARTMENT_OTHER): Payer: Self-pay | Admitting: Emergency Medicine

## 2019-07-10 ENCOUNTER — Emergency Department (HOSPITAL_BASED_OUTPATIENT_CLINIC_OR_DEPARTMENT_OTHER)
Admission: EM | Admit: 2019-07-10 | Discharge: 2019-07-10 | Disposition: A | Discharge status: Home / Self Care | Payer: BLUE CROSS/BLUE SHIELD | Attending: Emergency Medicine | Admitting: Emergency Medicine

## 2019-07-10 DIAGNOSIS — F17290 Nicotine dependence, other tobacco product, uncomplicated: Secondary | ICD-10-CM | POA: Insufficient documentation

## 2019-07-10 DIAGNOSIS — L02416 Cutaneous abscess of left lower limb: Secondary | ICD-10-CM | POA: Diagnosis present

## 2019-07-10 DIAGNOSIS — L0291 Cutaneous abscess, unspecified: Secondary | ICD-10-CM

## 2019-07-10 MED ORDER — LIDOCAINE-EPINEPHRINE (PF) 2 %-1:200000 IJ SOLN
10.0000 mL | Freq: Once | INTRAMUSCULAR | Status: DC
  Filled 2019-07-10 (×2): qty 10

## 2019-07-10 NOTE — ED Provider Notes (Signed)
[facility] EMERGENCY DEPARTMENT Provider Note   CSN: [id number] Arrival date & time: 07/10/19  1051     History   Chief Complaint Chief Complaint  Patient presents with  . Abscess    HPI Claudia Knapp is a 23 y.o. female.     The history is provided by the patient and medical records. No language interpreter was used.  Abscess Associated symptoms: no fever    Claudia Knapp is a 23 y.o. female who presents to the Emergency Department complaining of abscess to the left thigh.  I saw this patient yesterday for the same, but at that time, she did not want incision and drainage due to an event later that night.  She comes today requesting procedure.  No significant worsening since yesterday.  Still no fevers.  Still no drainage.   Past Medical History:  Diagnosis Date  . Anemia   . UUVOZDGU(440.3)     Patient Active Problem List   Diagnosis Date Noted  . Migraine without aura, without mention of intractable migraine without mention of status migrainosus 03/21/2013  . Tension headache 03/21/2013    History reviewed. No pertinent surgical history.   OB History   No obstetric history on file.      Home Medications    Prior to Admission medications   Medication Sig Start Date End Date Taking? Authorizing Provider  ibuprofen (ADVIL,MOTRIN) 600 MG tablet Take 1 tablet (600 mg total) by mouth every 6 (six) hours as needed. 04/15/18   Kirichenko, Tatyana, PA-C  sulfamethoxazole-trimethoprim (BACTRIM DS) 800-160 MG tablet Take 1 tablet by mouth 2 (two) times daily for 7 days. 07/09/19 07/16/19  Ward, Ozella Almond, PA-C    Family History Family History  Problem Relation Age of Onset  . Lung cancer Maternal Grandfather   . Stroke Paternal Grandfather   . Bone cancer Maternal Grandmother     Social History Social History   Tobacco Use  . Smoking status: Current Every Day Smoker    Packs/day: 0.00    Types: Cigars  . Smokeless tobacco: Never Used   Substance Use Topics  . Alcohol use: Yes    Comment: Twice a month.   . Drug use: Yes    Frequency: 3.0 times per week    Types: Marijuana     Allergies   Maxalt-mlt [rizatriptan benzoate]   Review of Systems Review of Systems  Constitutional: Negative for fever.  Skin: Positive for wound.     Physical Exam Updated Vital Signs BP 114/78   Pulse 96   Temp 98.2 F (36.8 C) (Oral)   Resp 16   Ht 5\' 1"  (1.549 m)   Wt 73.9 kg   LMP 07/09/2019   SpO2 99%   BMI 30.80 kg/m   Physical Exam Vitals signs and nursing note reviewed.  Constitutional:      General: She is not in acute distress.    Appearance: She is well-developed.  HENT:     Head: Normocephalic and atraumatic.  Neck:     Musculoskeletal: Neck supple.  Cardiovascular:     Rate and Rhythm: Normal rate and regular rhythm.     Heart sounds: Normal heart sounds. No murmur.  Pulmonary:     Effort: Pulmonary effort is normal. No respiratory distress.     Breath sounds: Normal breath sounds. No wheezing or rales.  Musculoskeletal: Normal range of motion.  Skin:    General: Skin is warm and dry.     Comments: 2x2 area of swelling  to the left thigh concerning for abscess.  Tender to palpation and does have mild surrounding erythema.  No drainage.   Neurological:     Mental Status: She is alert.      ED Treatments / Results  Labs (all labs ordered are listed, but only abnormal results are displayed) Labs Reviewed - No data to display  EKG None  Radiology No results found.  Procedures .Marland KitchenIncision and Drainage  Date/Time: 07/10/2019 12:32 PM Performed by: Ward, Chase Picket, PA-C Authorized by: Ward, Chase Picket, PA-C   Consent:    Consent obtained:  Verbal   Consent given by:  Patient   Risks discussed:  Bleeding, incomplete drainage, pain and infection Location:    Type:  Abscess   Size:  2x2   Location:  Lower extremity   Lower extremity location:  Leg   Leg location:  L upper leg  Pre-procedure details:    Skin preparation:  Betadine Anesthesia (see MAR for exact dosages):    Anesthesia method:  Local infiltration   Local anesthetic:  Lidocaine 2% WITH epi Procedure type:    Complexity:  Complex Procedure details:    Incision types:  Single straight   Scalpel blade:  11   Wound management:  Probed and deloculated   Drainage:  Purulent   Drainage amount:  Moderate   Wound treatment:  Wound left open Post-procedure details:    Patient tolerance of procedure:  Tolerated well, no immediate complications   (including critical care time)  Medications Ordered in ED Medications  lidocaine-EPINEPHrine (XYLOCAINE W/EPI) 2 %-1:200000 (PF) injection 10 mL (has no administration in time range)     Initial Impression / Assessment and Plan / ED Course  I have reviewed the triage vital signs and the nursing notes.  Pertinent labs & imaging results that were available during my care of the patient were reviewed by me and considered in my medical decision making (see chart for details).     Claudia Knapp is a 23 y.o. female who presents to ED for abscess requiring incision and drainage. I&D performed per procedure note above. Patient tolerated the procedure well.  She already has a prescription for antibiotics which I encouraged her to take until completion. Wound check in 2-3 days if not improving. Return to ER if concern for spread of infection, increasing pain, fevers or other concerns. All questions answered.   Final Clinical Impressions(s) / ED Diagnoses   Final diagnoses:  Abscess    ED Discharge Orders    None       Ward, Chase Picket, PA-C 07/10/19 1233    Tilden Fossa, MD 07/12/19 [phone]

## 2019-07-10 NOTE — Discharge Instructions (Signed)
Please take all of your antibiotics until finished! Pick them up from the pharmacy today.   Return to the emergency department if you develop a fever, new or worsening symptoms develop, any additional concerns.

## 2019-07-10 NOTE — ED Triage Notes (Signed)
Abscess to back of L thigh. She was here yesterday and left because she had to go to a lingerie party and did not want to have it drained prior to that.

## 2019-09-16 ENCOUNTER — Encounter (HOSPITAL_COMMUNITY): Payer: Self-pay | Admitting: Emergency Medicine

## 2019-09-16 ENCOUNTER — Other Ambulatory Visit: Payer: Self-pay

## 2019-09-16 ENCOUNTER — Emergency Department (HOSPITAL_COMMUNITY)
Admission: EM | Admit: 2019-09-16 | Discharge: 2019-09-16 | Disposition: A | Discharge status: Home / Self Care | Payer: BLUE CROSS/BLUE SHIELD | Attending: Emergency Medicine | Admitting: Emergency Medicine

## 2019-09-16 DIAGNOSIS — K0889 Other specified disorders of teeth and supporting structures: Secondary | ICD-10-CM | POA: Diagnosis present

## 2019-09-16 DIAGNOSIS — K029 Dental caries, unspecified: Secondary | ICD-10-CM | POA: Diagnosis not present

## 2019-09-16 DIAGNOSIS — F17290 Nicotine dependence, other tobacco product, uncomplicated: Secondary | ICD-10-CM | POA: Insufficient documentation

## 2019-09-16 MED ORDER — TRAMADOL HCL 50 MG PO TABS: 50.0000 mg | ORAL_TABLET | Freq: Four times a day (QID) | ORAL | 0 refills | Status: DC | PRN

## 2019-09-16 MED ORDER — CHLORHEXIDINE GLUCONATE 0.12 % MT SOLN: 15.0000 mL | Freq: Two times a day (BID) | OROMUCOSAL | 0 refills | Status: DC

## 2019-09-16 MED ORDER — NAPROXEN 375 MG PO TABS: 375.0000 mg | ORAL_TABLET | Freq: Two times a day (BID) | ORAL | 0 refills | Status: DC | PRN

## 2019-09-16 MED ORDER — PENICILLIN V POTASSIUM 500 MG PO TABS: 500.0000 mg | ORAL_TABLET | Freq: Four times a day (QID) | ORAL | 0 refills | Status: AC

## 2019-09-16 NOTE — ED Triage Notes (Signed)
Patient reports generalized dental pain for weeks. Reports chipped and broken teeth. Denies having a Pharmacist, community.

## 2019-09-16 NOTE — Discharge Instructions (Addendum)
Today you were seen for dental pain.  If you start to experience and new or worsening symptoms return to the emergency department. If you start to experience fever, chills, neck stiffness/pain, or inability to move your neck or open your mouth come back to the emergency department immediately. If you begin to experience any blistering, rashes, swelling, or difficulty breathing seek medical care for evaluation of potentially more serious side effects.  Medications:  -I have prescribed you an antibiotic  penicillin to treat the infection and Naproxen which is an anti-inflammatory medicine to treat the pain.  -Swish and spit 15 mL of chlorhexidine mouthwash for 30 seconds times daily.  You can also gargle warm salt water up to 5 times daily.  Both of these rinses can help to remove bacteria from the mouth.  You can also use viscous lidocaine every 3 hours to help numb the area.  You can apply a cool compress for 15 to 20 minutes, but avoid applying heat to the area.  -Patient has been sent to pharmacy for tramadol.  This is a narcotic pain medicine.  Do not drive or work while taking this medication as it can make you drowsy.  This is  only for severe pain. -Continue usual home medications.  2. Follow Up: Use the resource guide listed below to help you find a dentist if you do not already have one to follow up with. It is very important that you get evaluated by a dentist as soon as possible. Call tomorrow to schedule an appointment. Read the instructions below.     Be sure to eat something when taking the Naproxen as it can cause stomach upset and at worst stomach bleeding. Do not take additional non steroidal anti-inflammatory medicines such as Ibuprofen, Aleve, Advil, Mobic, Diclofenac, or goodie powder while taking Naproxen. You may supplement with Tylenol.    Dental Care: Organization         Address  Phone  Notes  [facility] Department of [facility] [address] [place], [place] [phone] Accepts children up to age 82 who are enrolled in [place] or [place] Health Choice; pregnant women with a Medicaid card; and children who have applied for Medicaid or [place] Health Choice, but were declined, whose parents can pay a reduced fee at time of service.  [facility] Department of [facility]  [address], [place] [phone] Accepts children up to age 50 who are enrolled in [place] or [place] Health Choice; pregnant women with a Medicaid card; and children who have applied for Medicaid or [place] Health Choice, but were declined, whose parents can pay a reduced fee at time of service.  [facility]  [address] [place], [place] [phone] Patients are seen by appointment only. Walk-ins are not accepted. [facility] will see patients 80 years of age and older. Monday - Tuesday (8am-5pm) Most Wednesdays (8:30-5pm) $30 per visit, cash only  [facility]  [address], [facility] [phone] Patients are seen by appointment only. Walk-ins are not accepted. [facility] will see patients 26 years of age and older. One Wednesday Evening (Monthly: Volunteer Based).  $30 per visit, cash only  [employer]  [phone] for adults; Children under age 59, call Graduate Pediatric Dentistry at [phone]. Children aged 87-14, please call [phone] to request a pediatric application.  Dental services are provided in  all areas of dental care including fillings, crowns and bridges, complete and partial dentures, implants, gum treatment, root canals, and extractions. Preventive care is also provided. Treatment is provided to both adults and children. Patients are selected via a lottery and there is often a waiting list.   [facility] [address] [place]  [phone] [email or web address]   [facility], [place], [place] [phone], Ext. [phone] Second and Fourth Thursday of each month, opens at 6:30 AM; Clinic ends at 9 AM.  Patients are seen on a first-come first-served basis, and a limited number are seen during each clinic.   [facility]  [address] [place], [place] [phone]   Eligibility Requirements You must have lived in [place], [place], or [place] counties for at least the last three months.   You cannot be eligible for state or federal sponsored [employer], including [employer], [place], or [employer].   You generally cannot be eligible for healthcare insurance through your employer.    How to apply: Eligibility screenings are held every Tuesday and Wednesday afternoon from 1:00 pm until 4:00 pm. You do not need an appointment for the interview!  [facility] [address], [place], [place]   [place] Department  [place] Department  [phone]    RESOURCE GUIDE:  Dental Problems  Patients with Medicaid: [place] [phone] [address] Phone:  [phone]                                                  Phone:  [phone]  If unable to pay or uninsured, contact:  [facility] or [facility]. to become qualified for the adult dental clinic.  Insufficient Money for Medicine Contact United Way:  call "211" or [place] [phone].  No Primary Care Doctor Call [place] Other agencies that provide inexpensive medical care    [place]  [phone]    [facility]  [place]  [phone]    [facility]  [phone]    [facility]  [phone]    [facility]  [phone]

## 2019-09-16 NOTE — ED Provider Notes (Signed)
[place] DEPT Provider Note   CSN: [id number] Arrival date & time: 09/16/19  1127     History Chief Complaint  Patient presents with  . Dental Pain    Claudia Knapp is a 23 y.o. female past medical history significant for anemia, headaches presents to emergency department today with chief complaint of dental pain x2 weeks.  Patient states pain has been intermittent.  She describes pain as aching sensation.  She rates it 10 of 10 in severity. Pain radiates throughout her mouth and is worse when eating. She has been using a straw and is able to tolerate PO liquids. She has a chipped tooth in her right upper jaw that she thinks is the source of her pain.  She reports intermittent facial swelling.  She has been taking Tylenol and ibuprofen without symptom relief.  She does not have a dentist and tried calling multiple offices in the area this morning prior to arrival without successfully being able to schedule an appointment.  Denies fever, chills, voice change, inability to control secretions, nausea/vomiting, dysphagia, odynophagia, drainage or trauma. History provided by patient with additional history obtained from chart review.        Past Medical History:  Diagnosis Date  . Anemia   . EZMOQHUT(654.6)     Patient Active Problem List   Diagnosis Date Noted  . Migraine without aura, without mention of intractable migraine without mention of status migrainosus 03/21/2013  . Tension headache 03/21/2013    History reviewed. No pertinent surgical history.   OB History   No obstetric history on file.     Family History  Problem Relation Age of Onset  . Lung cancer Maternal Grandfather   . Stroke Paternal Grandfather   . Bone cancer Maternal Grandmother     Social History   Tobacco Use  . Smoking status: Current Every Day Smoker    Packs/day: 0.00    Types: Cigars  . Smokeless tobacco: Never Used  Substance Use Topics  . Alcohol  use: Yes    Comment: Twice a month.   . Drug use: Yes    Frequency: 3.0 times per week    Types: Marijuana    Home Medications Prior to Admission medications   Medication Sig Start Date End Date Taking? Authorizing Provider  chlorhexidine (PERIDEX) 0.12 % solution Use as directed 15 mLs in the mouth or throat 2 (two) times daily. 09/16/19   Mardel Grudzien E, PA-C  ibuprofen (ADVIL,MOTRIN) 600 MG tablet Take 1 tablet (600 mg total) by mouth every 6 (six) hours as needed. 04/15/18   Kirichenko, Tatyana, PA-C  naproxen (NAPROSYN) 375 MG tablet Take 1 tablet (375 mg total) by mouth 2 (two) times daily as needed. 09/16/19   Isai Gottlieb E, PA-C  penicillin v potassium (VEETID) 500 MG tablet Take 1 tablet (500 mg total) by mouth 4 (four) times daily for 7 days. 09/16/19 09/23/19  Aimar Borghi E, PA-C  traMADol (ULTRAM) 50 MG tablet Take 1 tablet (50 mg total) by mouth every 6 (six) hours as needed for severe pain. 09/16/19   Rutherford Alarie, Harley Hallmark, PA-C    Allergies    Maxalt-mlt [rizatriptan benzoate]  Review of Systems   Review of Systems All other systems are reviewed and are negative for acute change except as noted in the HPI.  Physical Exam Updated Vital Signs BP (!) 133/95   Pulse 94   Temp 98.9 F (37.2 C) (Oral)   Resp 15   LMP 09/13/2019  SpO2 100%   Physical Exam Vitals and nursing note reviewed.  Constitutional:      General: She is not in acute distress.    Appearance: She is not toxic-appearing.  HENT:     Head: Normocephalic and atraumatic.     Nose: Nose normal.     Mouth/Throat:      Comments: Multiple teeth with dental decay and caries. No noted area of swelling or fluctuance. No trismus. Mouth opening to at least 3 finger widths. Handles oral secretions without difficulty. External exam shows no asymmetry of the jaw line or face, no signs of obvious swelling or infection. No swelling or tenderness to the submental or submandibular regions. No  swelling or tenderness into the soft tissues of the neck.Full active range of motion of the jaw. Neck is supple with full active range of motion, no tenderness to palpation of the soft tissues.    Eyes:     General: No scleral icterus.       Right eye: No discharge.        Left eye: No discharge.     Conjunctiva/sclera: Conjunctivae normal.  Cardiovascular:     Rate and Rhythm: Normal rate and regular rhythm.     Pulses: Normal pulses.     Heart sounds: Normal heart sounds.  Pulmonary:     Effort: Pulmonary effort is normal.     Breath sounds: Normal breath sounds.  Abdominal:     General: There is no distension.  Musculoskeletal:        General: Normal range of motion.     Cervical back: Normal range of motion. No muscular tenderness.  Skin:    General: Skin is warm and dry.  Neurological:     Mental Status: She is oriented to person, place, and time.  Psychiatric:        Behavior: Behavior normal.        ED Results / Procedures / Treatments   Labs (all labs ordered are listed, but only abnormal results are displayed) Labs Reviewed - No data to display  EKG None  Radiology No results found.  Procedures Procedures (including critical care time)  Medications Ordered in ED Medications - No data to display  ED Course  I have reviewed the triage vital signs and the nursing notes.  Pertinent labs & imaging results that were available during my care of the patient were reviewed by me and considered in my medical decision making (see chart for details).    MDM Rules/Calculators/A&P     CHA2DS2/VAS Stroke Risk Points      N/A >= 2 Points: High Risk  1 - 1.99 Points: Medium Risk  0 Points: Low Risk    A final score could not be computed because of missing components.: Last  Change: N/A   This score determines the patient's risk of having a stroke if the  patient has atrial fibrillation.   This score is not applicable to this patient. Components are not    calculated.   Pt is well appearing.  Chronic dental decay. Patient with toothache.  No gross abscess.  Exam unconcerning for Ludwig's angina or spread of infection.  Will treat with penicillin and anti-inflammatories, chlorhexidine solution.  Urged patient to follow-up with dentist. I have reviewed the PDMP during this encounter.  Patient has no recent narcotic prescriptions, will treat with short course of tramadol as she continues to find a dentist in the area. VSS.  Pt appears stable for d/c  Portions of this note were generated with Scientist, clinical (histocompatibility and immunogenetics). Dictation errors may occur despite best attempts at proofreading.   Final Clinical Impression(s) / ED Diagnoses Final diagnoses:  Pain, dental    Rx / DC Orders ED Discharge Orders         Ordered    naproxen (NAPROSYN) 375 MG tablet  2 times daily PRN     09/16/19 1242    penicillin v potassium (VEETID) 500 MG tablet  4 times daily     09/16/19 1242    chlorhexidine (PERIDEX) 0.12 % solution  2 times daily     09/16/19 1242    traMADol (ULTRAM) 50 MG tablet  Every 6 hours PRN     09/16/19 1243           Sherene Sires, PA-C 09/16/19 1413    Glynn Octave, MD 09/16/19 1515

## 2019-11-15 ENCOUNTER — Emergency Department (HOSPITAL_COMMUNITY)
Admission: EM | Admit: 2019-11-15 | Discharge: 2019-11-16 | Disposition: A | Discharge status: Home / Self Care | Payer: BLUE CROSS/BLUE SHIELD | Attending: Emergency Medicine | Admitting: Emergency Medicine

## 2019-11-15 ENCOUNTER — Other Ambulatory Visit: Payer: Self-pay

## 2019-11-15 ENCOUNTER — Encounter (HOSPITAL_COMMUNITY): Payer: Self-pay | Admitting: Family Medicine

## 2019-11-15 DIAGNOSIS — F17210 Nicotine dependence, cigarettes, uncomplicated: Secondary | ICD-10-CM | POA: Diagnosis not present

## 2019-11-15 DIAGNOSIS — S0993XA Unspecified injury of face, initial encounter: Secondary | ICD-10-CM | POA: Diagnosis not present

## 2019-11-15 DIAGNOSIS — Y939 Activity, unspecified: Secondary | ICD-10-CM | POA: Insufficient documentation

## 2019-11-15 DIAGNOSIS — S0501XA Injury of conjunctiva and corneal abrasion without foreign body, right eye, initial encounter: Secondary | ICD-10-CM | POA: Diagnosis not present

## 2019-11-15 DIAGNOSIS — F1210 Cannabis abuse, uncomplicated: Secondary | ICD-10-CM | POA: Diagnosis not present

## 2019-11-15 DIAGNOSIS — S300XXA Contusion of lower back and pelvis, initial encounter: Secondary | ICD-10-CM | POA: Diagnosis not present

## 2019-11-15 DIAGNOSIS — S0591XA Unspecified injury of right eye and orbit, initial encounter: Secondary | ICD-10-CM | POA: Diagnosis present

## 2019-11-15 DIAGNOSIS — Y999 Unspecified external cause status: Secondary | ICD-10-CM | POA: Diagnosis not present

## 2019-11-15 DIAGNOSIS — Y929 Unspecified place or not applicable: Secondary | ICD-10-CM | POA: Insufficient documentation
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
## 2019-11-16 ENCOUNTER — Emergency Department (HOSPITAL_COMMUNITY): Payer: BLUE CROSS/BLUE SHIELD

## 2019-11-16 MED ORDER — TETRACAINE HCL 0.5 % OP SOLN
2.0000 [drp] | Freq: Once | OPHTHALMIC | Status: AC
  Administered 2019-11-16: 01:00:00 2 [drp] via OPHTHALMIC
  Filled 2019-11-16: qty 4

## 2019-11-16 MED ORDER — MELOXICAM 15 MG PO TABS: 15.0000 mg | ORAL_TABLET | Freq: Every day | ORAL | 0 refills | Status: DC

## 2019-11-16 MED ORDER — ERYTHROMYCIN 5 MG/GM OP OINT: TOPICAL_OINTMENT | OPHTHALMIC | 0 refills | Status: DC

## 2019-11-16 MED ORDER — FLUORESCEIN SODIUM 1 MG OP STRP
1.0000 | ORAL_STRIP | Freq: Once | OPHTHALMIC | Status: AC
  Administered 2019-11-16: 01:00:00 1 via OPHTHALMIC
  Filled 2019-11-16: qty 1

## 2019-11-16 MED ORDER — CYCLOBENZAPRINE HCL 10 MG PO TABS: 5.0000 mg | ORAL_TABLET | Freq: Two times a day (BID) | ORAL | 0 refills | Status: DC | PRN

## 2019-11-16 NOTE — Discharge Instructions (Signed)

## 2019-11-16 NOTE — ED Notes (Addendum)
Pt signed waiver for Xray, pt does not need pregnancy test, stated she was menstruating.
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
## 2019-11-16 NOTE — ED Notes (Signed)
Pt transported to Xray. 
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
## 2021-10-14 ENCOUNTER — Encounter (HOSPITAL_COMMUNITY): Payer: Self-pay

## 2021-10-14 ENCOUNTER — Other Ambulatory Visit: Payer: Self-pay

## 2021-10-14 ENCOUNTER — Emergency Department (HOSPITAL_COMMUNITY)
Admission: EM | Admit: 2021-10-14 | Discharge: 2021-10-15 | Disposition: A | Discharge status: Home / Self Care | Payer: BLUE CROSS/BLUE SHIELD | Attending: Emergency Medicine | Admitting: Emergency Medicine

## 2021-10-14 DIAGNOSIS — J029 Acute pharyngitis, unspecified: Secondary | ICD-10-CM | POA: Insufficient documentation

## 2021-10-14 DIAGNOSIS — Z20822 Contact with and (suspected) exposure to covid-19: Secondary | ICD-10-CM | POA: Insufficient documentation

## 2021-10-14 DIAGNOSIS — R200 Anesthesia of skin: Secondary | ICD-10-CM | POA: Insufficient documentation

## 2021-10-14 DIAGNOSIS — Z91040 Latex allergy status: Secondary | ICD-10-CM | POA: Insufficient documentation

## 2021-10-14 HISTORY — DX: Dorsalgia, unspecified: M54.9

## 2021-10-14 LAB — BASIC METABOLIC PANEL
Anion gap: 6 (ref 5–15)
BUN: 7 mg/dL (ref 6–20)
CO2: 25 mmol/L (ref 22–32)
Calcium: 8.5 mg/dL — ABNORMAL LOW (ref 8.9–10.3)
Chloride: 105 mmol/L (ref 98–111)
Creatinine, Ser: 0.61 mg/dL (ref 0.44–1.00)
GFR, Estimated: >60 mL/min (ref >60)
Glucose, Bld: 85 mg/dL (ref 70–99)
Potassium: 3.5 mmol/L (ref 3.5–5.1)
Sodium: 136 mmol/L (ref 135–145)

## 2021-10-14 LAB — RESP PANEL BY RT-PCR (FLU A&B, COVID) ARPGX2
Influenza A by PCR: NEGATIVE
Influenza B by PCR: NEGATIVE
SARS Coronavirus 2 by RT PCR: NEGATIVE

## 2021-10-14 LAB — CBC
HCT: 35.4 % — ABNORMAL LOW (ref 36.0–46.0)
Hemoglobin: 11.3 g/dL — ABNORMAL LOW (ref 12.0–15.0)
MCH: 24.4 pg — ABNORMAL LOW (ref 26.0–34.0)
MCHC: 31.9 g/dL (ref 30.0–36.0)
MCV: 76.3 fL — ABNORMAL LOW (ref 80.0–100.0)
Platelets: 287 10*3/uL (ref 150–400)
RBC: 4.64 MIL/uL (ref 3.87–5.11)
RDW: 26.5 % — ABNORMAL HIGH (ref 11.5–15.5)
WBC: 4.7 10*3/uL (ref 4.0–10.5)
nRBC: 0 % (ref 0.0–0.2)

## 2021-10-14 NOTE — ED Provider Notes (Signed)
[facility] DEPT Provider Note   CSN: [id number] Arrival date & time: 10/14/21  1419     History Past Medical History:  Diagnosis Date   Anemia    Back pain    Headache(784.0)     Chief Complaint  Patient presents with   leg numbness   Sore Throat    Claudia Knapp is a 26 y.o. female.  Patient says that she has been having several days of her right leg going numb. It feels weird when she sits or lays down. She says that she has also noticed a rash on her leg that first stared a month ago, got better, but then seemed to get worse recently. It does not hurt but she is concerned about what is causing it. She also has been having a sore throat on and off for the last several weeks that comes and goes but she is otherwise feeling well.    Sore Throat Associated symptoms include headaches. Pertinent negatives include no shortness of breath.      Home Medications Prior to Admission medications   Medication Sig Start Date End Date Taking? Authorizing Provider  ibuprofen (ADVIL) 200 MG tablet Take 200-400 mg by mouth every 6 (six) hours as needed for mild pain or headache.   Yes [provider]  chlorhexidine (PERIDEX) 0.12 % solution Use as directed 15 mLs in the mouth or throat 2 (two) times daily. Patient not taking: Reported on 10/14/2021 09/16/19   Barrie Folk, PA-C  cyclobenzaprine (FLEXERIL) 10 MG tablet Take 0.5-1 tablets (5-10 mg total) by mouth 2 (two) times daily as needed for muscle spasms. Patient not taking: Reported on 10/14/2021 11/16/19   Margarita Mail, PA-C  erythromycin ophthalmic ointment Place a 1/2 inch ribbon of ointment into the lower eyelid of the Right Eye 4 times a day for 5 days Patient not taking: Reported on 10/14/2021 11/16/19   Margarita Mail, PA-C  meloxicam (MOBIC) 15 MG tablet Take 1 tablet (15 mg total) by mouth daily. Take 1 daily with food. Patient not taking: Reported on 10/14/2021 11/16/19   Margarita Mail, PA-C      Allergies    Maxalt-mlt [rizatriptan benzoate] and Latex    Review of Systems   Review of Systems  Constitutional:  Negative for fatigue and fever.  HENT:  Positive for sore throat.   Respiratory:  Negative for cough and shortness of breath.   Musculoskeletal:  Negative for joint swelling.  Neurological:  Positive for tremors, numbness and headaches. Negative for dizziness, syncope and weakness.   Physical Exam Updated Vital Signs BP (!) 128/102 (BP Location: Left Arm)    Pulse (!) 108    Temp 99.4 F (37.4 C) (Oral)    Resp 18    Ht 5\' 1"  (1.549 m)    Wt 68.2 kg    LMP 10/05/2021    SpO2 100%    BMI 28.42 kg/m  Physical Exam Constitutional:      Appearance: She is well-developed. She is not ill-appearing.  HENT:     Head: Normocephalic and atraumatic.  Cardiovascular:     Rate and Rhythm: Normal rate and regular rhythm.     Heart sounds: Normal heart sounds.  Pulmonary:     Effort: Pulmonary effort is normal.     Breath sounds: Normal breath sounds.  Abdominal:     Palpations: Abdomen is soft.     Tenderness: There is no abdominal tenderness.  Skin:    General: Skin  is warm and dry.     Comments: Reticulated non-palpable discoloration of the RLE on the medial aspect of the thigh and calf.   Neurological:     General: No focal deficit present.     Mental Status: She is alert and oriented to person, place, and time.     Sensory: Sensation is intact. No sensory deficit.     Motor: Motor function is intact.  Psychiatric:        Mood and Affect: Mood normal.    ED Results / Procedures / Treatments   Labs (all labs ordered are listed, but only abnormal results are displayed) Labs Reviewed  CBC - Abnormal; Notable for the following components:      Result Value   Hemoglobin 11.3 (*)    HCT 35.4 (*)    MCV 76.3 (*)    MCH 24.4 (*)    RDW 26.5 (*)    All other components within normal limits  BASIC METABOLIC PANEL - Abnormal; Notable for the  following components:   Calcium 8.5 (*)    All other components within normal limits  RESP PANEL BY RT-PCR (FLU A&B, COVID) ARPGX2    EKG None  Radiology No results found.  Procedures Procedures    Medications Ordered in ED Medications - No data to display  ED Course/ Medical Decision Making/ A&P                           Medical Decision Making  Patient is complaining of non-specific numbness of her RLE that comes and goes. On exam she has full intact sensation and of the lower extremities. Considered head imaging but low suspicion for CVA at this time. Will provide patient with a referral to outpatient neurology.  Her rash is concerning for a possible vasculitis. However patient is overall stable with normal vital signs. Labs were reviewed by me and they are unremarkable. Will provide referral to rheumatology and have patient be worked up as an outpatient.  Final Clinical Impression(s) / ED Diagnoses Final diagnoses:  Leg numbness    Rx / DC Orders ED Discharge Orders     None         Scarlett Presto, MD 10/15/21 0004    Deno Etienne, DO 10/15/21 0009

## 2021-10-14 NOTE — Discharge Instructions (Addendum)
Claudia Knapp  You were seen at [facility] Emergency Room for leg numbness. We looked at your blood work and did an exam which did not show any signs of infection. We will refer you to a neurologist as well as a rheumatologist to look more into your leg rash and your numbness.  If you have new one sided weakness, difficulty breathing, confusion, or chest pain you should go back to the emergency room.

## 2021-10-14 NOTE — ED Triage Notes (Addendum)
Patient reports that she woke up 3 days ago and had right leg numbness. Patient also reports that she has bruising to her right leg that looks like water (mottling), not a regular bruise. Patient also reports chronic back pain.  Patient also c/o sore throat x 2-3 days. Patient denies any fever chills.

## 2021-10-14 NOTE — ED Provider Triage Note (Signed)
Emergency Medicine Provider Triage Evaluation Note  Claudia Knapp , a 26 y.o. female  was evaluated in triage.  Pt complains of 4-day duration of right lower extremity numbness and tingling located between right knee and ankle.  She states she woke up like this Friday and has stayed constant since then.  She reports sore throat of 2-day duration.  She denies fever, chills.  Review of Systems  Positive: As above Negative: As above  Physical Exam  BP (!) 128/93 (BP Location: Left Arm)    Pulse (!) 103    Temp 99.4 F (37.4 C) (Oral)    Resp 16    Ht 5\' 1"  (1.549 m)    Wt 68.2 kg    LMP 10/05/2021    SpO2 100%    BMI 28.42 kg/m  Gen:   Awake, no distress   Resp:  Normal effort  MSK:   Moves extremities without difficulty  Other:  Reports decreased sensation in right lower extremity.  Strength intact and symmetrical.  2+ DP pulses bilaterally.  5/5 strength in bilateral lower extremities.  Medical Decision Making  Medically screening exam initiated at 3:28 PM.  Appropriate orders placed.  Hennessy Bartel was informed that the remainder of the evaluation will be completed by another provider, this initial triage assessment does not replace that evaluation, and the importance of remaining in the ED until their evaluation is complete.     Felipa Eth, PA-C 10/14/21 1530

## 2021-10-15 ENCOUNTER — Encounter: Payer: Self-pay | Admitting: Neurology

## 2021-12-13 ENCOUNTER — Ambulatory Visit: Payer: Self-pay | Admitting: Neurology

## 2021-12-13 ENCOUNTER — Encounter: Payer: Self-pay | Admitting: Neurology

## 2022-11-15 ENCOUNTER — Emergency Department (HOSPITAL_BASED_OUTPATIENT_CLINIC_OR_DEPARTMENT_OTHER)
Admission: EM | Admit: 2022-11-15 | Discharge: 2022-11-15 | Disposition: A | Discharge status: Home / Self Care | Payer: BLUE CROSS/BLUE SHIELD | Attending: Emergency Medicine | Admitting: Emergency Medicine

## 2022-11-15 ENCOUNTER — Other Ambulatory Visit: Payer: Self-pay

## 2022-11-15 ENCOUNTER — Encounter (HOSPITAL_BASED_OUTPATIENT_CLINIC_OR_DEPARTMENT_OTHER): Payer: Self-pay | Admitting: Emergency Medicine

## 2022-11-15 ENCOUNTER — Emergency Department (HOSPITAL_BASED_OUTPATIENT_CLINIC_OR_DEPARTMENT_OTHER): Payer: BLUE CROSS/BLUE SHIELD

## 2022-11-15 DIAGNOSIS — Z91040 Latex allergy status: Secondary | ICD-10-CM | POA: Insufficient documentation

## 2022-11-15 DIAGNOSIS — D649 Anemia, unspecified: Secondary | ICD-10-CM | POA: Diagnosis not present

## 2022-11-15 DIAGNOSIS — N760 Acute vaginitis: Secondary | ICD-10-CM | POA: Insufficient documentation

## 2022-11-15 DIAGNOSIS — E876 Hypokalemia: Secondary | ICD-10-CM | POA: Insufficient documentation

## 2022-11-15 DIAGNOSIS — K769 Liver disease, unspecified: Secondary | ICD-10-CM | POA: Diagnosis not present

## 2022-11-15 DIAGNOSIS — E871 Hypo-osmolality and hyponatremia: Secondary | ICD-10-CM | POA: Insufficient documentation

## 2022-11-15 DIAGNOSIS — Z20822 Contact with and (suspected) exposure to covid-19: Secondary | ICD-10-CM | POA: Diagnosis not present

## 2022-11-15 DIAGNOSIS — A599 Trichomoniasis, unspecified: Secondary | ICD-10-CM

## 2022-11-15 DIAGNOSIS — B9689 Other specified bacterial agents as the cause of diseases classified elsewhere: Secondary | ICD-10-CM | POA: Diagnosis not present

## 2022-11-15 DIAGNOSIS — R509 Fever, unspecified: Secondary | ICD-10-CM | POA: Diagnosis present

## 2022-11-15 DIAGNOSIS — A5900 Urogenital trichomoniasis, unspecified: Secondary | ICD-10-CM | POA: Insufficient documentation

## 2022-11-15 DIAGNOSIS — N12 Tubulo-interstitial nephritis, not specified as acute or chronic: Secondary | ICD-10-CM | POA: Insufficient documentation

## 2022-11-15 LAB — CBC WITH DIFFERENTIAL/PLATELET
Abs Immature Granulocytes: 0.01 10*3/uL (ref 0.00–0.07)
Basophils Absolute: 0 10*3/uL (ref 0.0–0.1)
Basophils Relative: 0 %
Eosinophils Absolute: 0 10*3/uL (ref 0.0–0.5)
Eosinophils Relative: 0 %
HCT: 33.9 % — ABNORMAL LOW (ref 36.0–46.0)
Hemoglobin: 11 g/dL — ABNORMAL LOW (ref 12.0–15.0)
Immature Granulocytes: 0 %
Lymphocytes Relative: 19 %
Lymphs Abs: 0.7 10*3/uL (ref 0.7–4.0)
MCH: 25 pg — ABNORMAL LOW (ref 26.0–34.0)
MCHC: 32.4 g/dL (ref 30.0–36.0)
MCV: 77 fL — ABNORMAL LOW (ref 80.0–100.0)
Monocytes Absolute: 0.7 10*3/uL (ref 0.1–1.0)
Monocytes Relative: 19 %
Neutro Abs: 2.4 10*3/uL (ref 1.7–7.7)
Neutrophils Relative %: 62 %
Platelets: 197 10*3/uL (ref 150–400)
RBC: 4.4 MIL/uL (ref 3.87–5.11)
RDW: 23.2 % — ABNORMAL HIGH (ref 11.5–15.5)
WBC: 3.8 10*3/uL — ABNORMAL LOW (ref 4.0–10.5)
nRBC: 0 % (ref 0.0–0.2)

## 2022-11-15 LAB — COMPREHENSIVE METABOLIC PANEL
ALT: 15 U/L (ref 0–44)
AST: 33 U/L (ref 15–41)
Albumin: 3.2 g/dL — ABNORMAL LOW (ref 3.5–5.0)
Alkaline Phosphatase: 77 U/L (ref 38–126)
Anion gap: 5 (ref 5–15)
BUN: <5 mg/dL — ABNORMAL LOW (ref 6–20)
CO2: 23 mmol/L (ref 22–32)
Calcium: 8.3 mg/dL — ABNORMAL LOW (ref 8.9–10.3)
Chloride: 102 mmol/L (ref 98–111)
Creatinine, Ser: 0.76 mg/dL (ref 0.44–1.00)
GFR, Estimated: >60 mL/min (ref >60)
Glucose, Bld: 114 mg/dL — ABNORMAL HIGH (ref 70–99)
Potassium: 2.8 mmol/L — ABNORMAL LOW (ref 3.5–5.1)
Sodium: 130 mmol/L — ABNORMAL LOW (ref 135–145)
Total Bilirubin: 0.5 mg/dL (ref 0.3–1.2)
Total Protein: 8 g/dL (ref 6.5–8.1)

## 2022-11-15 LAB — URINALYSIS, ROUTINE W REFLEX MICROSCOPIC
Bilirubin Urine: NEGATIVE
Glucose, UA: NEGATIVE mg/dL
Hgb urine dipstick: NEGATIVE
Ketones, ur: NEGATIVE mg/dL
Nitrite: NEGATIVE
Protein, ur: NEGATIVE mg/dL
Specific Gravity, Urine: 1.01 (ref 1.005–1.030)
pH: 7 (ref 5.0–8.0)

## 2022-11-15 LAB — WET PREP, GENITAL
Sperm: NONE SEEN
WBC, Wet Prep HPF POC: <10 (ref <10)
Yeast Wet Prep HPF POC: NONE SEEN

## 2022-11-15 LAB — RESP PANEL BY RT-PCR (RSV, FLU A&B, COVID)  RVPGX2
Influenza A by PCR: NEGATIVE
Influenza B by PCR: NEGATIVE
Resp Syncytial Virus by PCR: NEGATIVE
SARS Coronavirus 2 by RT PCR: NEGATIVE

## 2022-11-15 LAB — PREGNANCY, URINE: Preg Test, Ur: NEGATIVE

## 2022-11-15 LAB — URINALYSIS, MICROSCOPIC (REFLEX)

## 2022-11-15 MED ORDER — CEPHALEXIN 500 MG PO CAPS: 500.0000 mg | ORAL_CAPSULE | Freq: Three times a day (TID) | ORAL | 0 refills | Status: AC

## 2022-11-15 MED ORDER — POTASSIUM CHLORIDE CRYS ER 20 MEQ PO TBCR: 20.0000 meq | EXTENDED_RELEASE_TABLET | Freq: Two times a day (BID) | ORAL | 0 refills | Status: AC

## 2022-11-15 MED ORDER — CEFTRIAXONE SODIUM 2 G IJ SOLR
2.0000 g | Freq: Once | INTRAMUSCULAR | Status: AC
  Administered 2022-11-15: 2 g via INTRAVENOUS
  Filled 2022-11-15: qty 20

## 2022-11-15 MED ORDER — SODIUM CHLORIDE 0.9 % IV BOLUS
1000.0000 mL | Freq: Once | INTRAVENOUS | Status: AC
  Administered 2022-11-15: 1000 mL via INTRAVENOUS

## 2022-11-15 MED ORDER — POTASSIUM CHLORIDE CRYS ER 20 MEQ PO TBCR
40.0000 meq | EXTENDED_RELEASE_TABLET | Freq: Once | ORAL | Status: AC
  Administered 2022-11-15: 40 meq via ORAL
  Filled 2022-11-15: qty 2

## 2022-11-15 MED ORDER — METRONIDAZOLE 500 MG PO TABS: 500.0000 mg | ORAL_TABLET | Freq: Two times a day (BID) | ORAL | 0 refills | Status: AC

## 2022-11-15 MED ORDER — ACETAMINOPHEN 325 MG PO TABS
650.0000 mg | ORAL_TABLET | Freq: Once | ORAL | Status: AC
  Administered 2022-11-15: 650 mg via ORAL
  Filled 2022-11-15: qty 2

## 2022-11-15 NOTE — ED Triage Notes (Signed)
Pt c/o body aches, HA x 2d; feeling hot; last dose of ibuprofen around1030

## 2022-11-15 NOTE — ED Provider Notes (Signed)
[facility] Provider Note   CSN: [id number] Arrival date & time: 11/15/22  1325     History  Chief Complaint  Patient presents with   Generalized Body Aches    Claudia Knapp is a 27 y.o. female.  With a history of anemia, back pain, headaches who presents to the ED for evaluation of bodyaches, subjective fever, congestion, rhinorrhea, nonproductive cough.  Symptoms began yesterday shortly after waking up.  She denies sick contacts.  She states she has had her flu and COVID vaccines this year.  States her body aches are all over but worse in her low back.  Denies urinary symptoms, chest pain, shortness of breath, abdominal pain, nausea, vomiting, trauma to the low back.  She states she has not been able to eat since yesterday because she is felt too fatigued to get food for herself.  She reports she has had unprotected intercourse twice with a new sexual partner.  Denies vaginal discharge, odor, bleeding, vaginal pain or pelvic pain, lower abdominal pain.  HPI     Home Medications Prior to Admission medications   Medication Sig Start Date End Date Taking? Authorizing Provider  cephALEXin (KEFLEX) 500 MG capsule Take 1 capsule (500 mg total) by mouth 3 (three) times daily for 7 days. 11/15/22 11/22/22 Yes Nili Honda, [place] Folk, PA-C  metroNIDAZOLE (FLAGYL) 500 MG tablet Take 1 tablet (500 mg total) by mouth 2 (two) times daily. 11/15/22  Yes Schae Cando, [place]on Folk, PA-C  potassium chloride SA (KLOR-CON M) 20 MEQ tablet Take 1 tablet (20 mEq total) by mouth 2 (two) times daily for 4 days. 11/15/22 11/19/22 Yes Kanda Deluna, [place]on Folk, PA-C  chlorhexidine (PERIDEX) 0.12 % solution Use as directed 15 mLs in the mouth or throat 2 (two) times daily. Patient not taking: Reported on 10/14/2021 09/16/19   Barrie Folk, PA-C  cyclobenzaprine (FLEXERIL) 10 MG tablet Take 0.5-1 tablets (5-10 mg total) by mouth 2 (two) times daily as needed for muscle  spasms. Patient not taking: Reported on 10/14/2021 11/16/19   Margarita Mail, PA-C  erythromycin ophthalmic ointment Place a 1/2 inch ribbon of ointment into the lower eyelid of the Right Eye 4 times a day for 5 days Patient not taking: Reported on 10/14/2021 11/16/19   Margarita Mail, PA-C  ibuprofen (ADVIL) 200 MG tablet Take 200-400 mg by mouth every 6 (six) hours as needed for mild pain or headache.    [provider]  meloxicam (MOBIC) 15 MG tablet Take 1 tablet (15 mg total) by mouth daily. Take 1 daily with food. Patient not taking: Reported on 10/14/2021 11/16/19   Margarita Mail, PA-C      Allergies    Maxalt-mlt [rizatriptan benzoate], Iohexol, and Latex    Review of Systems   Review of Systems  Constitutional:  Positive for fatigue.  HENT:  Positive for congestion and rhinorrhea.   All other systems reviewed and are negative.   Physical Exam Updated Vital Signs BP 129/86   Pulse 97   Temp 100.1 F (37.8 C)   Resp 18   Ht 5' 1"$  (1.549 m)   Wt 66.2 kg   LMP 11/07/2022   SpO2 99%   BMI 27.59 kg/m  Physical Exam Vitals and nursing note reviewed.  Constitutional:      General: She is not in acute distress.    Appearance: Normal appearance. She is well-developed. She is obese. She is not ill-appearing, toxic-appearing or diaphoretic.     Comments: Resting  comfortably in bed  HENT:     Head: Normocephalic and atraumatic.     Nose: Congestion and rhinorrhea present.     Mouth/Throat:     Mouth: Mucous membranes are moist.     Pharynx: Oropharynx is clear. Posterior oropharyngeal erythema (Posterior pharyngeal) present. No oropharyngeal exudate.  Eyes:     Conjunctiva/sclera: Conjunctivae normal.  Cardiovascular:     Rate and Rhythm: Normal rate and regular rhythm.     Pulses: Normal pulses.     Heart sounds: No murmur heard. Pulmonary:     Effort: Pulmonary effort is normal. No respiratory distress.     Breath sounds: Normal breath sounds. No stridor. No  wheezing, rhonchi or rales.  Abdominal:     Palpations: Abdomen is soft.     Tenderness: There is no abdominal tenderness. There is right CVA tenderness and left CVA tenderness. There is no guarding.  Musculoskeletal:        General: No swelling.     Cervical back: Neck supple. No rigidity.  Skin:    General: Skin is warm and dry.     Capillary Refill: Capillary refill takes less than 2 seconds.  Neurological:     General: No focal deficit present.     Mental Status: She is alert and oriented to person, place, and time.  Psychiatric:        Mood and Affect: Mood normal.        Behavior: Behavior normal.     ED Results / Procedures / Treatments   Labs (all labs ordered are listed, but only abnormal results are displayed) Labs Reviewed  WET PREP, GENITAL - Abnormal; Notable for the following components:      Result Value   Trich, Wet Prep PRESENT (*)    Clue Cells Wet Prep HPF POC PRESENT (*)    All other components within normal limits  URINALYSIS, ROUTINE W REFLEX MICROSCOPIC - Abnormal; Notable for the following components:   APPearance HAZY (*)    Leukocytes,Ua MODERATE (*)    All other components within normal limits  URINALYSIS, MICROSCOPIC (REFLEX) - Abnormal; Notable for the following components:   Bacteria, UA FEW (*)    Trichomonas, UA PRESENT (*)    All other components within normal limits  CBC WITH DIFFERENTIAL/PLATELET - Abnormal; Notable for the following components:   WBC 3.8 (*)    Hemoglobin 11.0 (*)    HCT 33.9 (*)    MCV 77.0 (*)    MCH 25.0 (*)    RDW 23.2 (*)    All other components within normal limits  COMPREHENSIVE METABOLIC PANEL - Abnormal; Notable for the following components:   Sodium 130 (*)    Potassium 2.8 (*)    Glucose, Bld 114 (*)    BUN <5 (*)    Calcium 8.3 (*)    Albumin 3.2 (*)    All other components within normal limits  RESP PANEL BY RT-PCR (RSV, FLU A&B, COVID)  RVPGX2  URINE CULTURE  PREGNANCY, URINE  GC/CHLAMYDIA PROBE  AMP ([place]) NOT AT [facility]    EKG None  Radiology CT ABDOMEN PELVIS WO CONTRAST  Result Date: 11/15/2022 CLINICAL DATA:  Right flank and abdominal pain. Fever. Suspected pyelonephritis. EXAM: CT ABDOMEN AND PELVIS WITHOUT CONTRAST TECHNIQUE: Multidetector CT imaging of the abdomen and pelvis was performed following the standard protocol without IV contrast. RADIATION DOSE REDUCTION: This exam was performed according to the departmental dose-optimization program which includes automated exposure control, adjustment of the  mA and/or kV according to patient size and/or use of iterative reconstruction technique. COMPARISON:  None Available. FINDINGS: Lower chest: No acute findings. Hepatobiliary: Poorly defined low-attenuation lesion is seen in the anterior right hepatic lobe which measures 3.0 x 2.7 cm. This cannot be characterized on this unenhanced exam. No other liver lesions are visualized on this noncontrast study. Gallbladder is unremarkable. No evidence of biliary ductal dilatation. Pancreas: No mass or inflammatory process visualized on this unenhanced exam. Spleen:  Within normal limits in size. Adrenals/Urinary tract: No evidence of urolithiasis or hydronephrosis. A heterogeneous low-attenuation lesion is seen in the lower pole of the right kidney which measures 5.5 x 4.5 cm. This lesion cannot be characterized on this unenhanced exam, however it is suspicious for pyelonephritis or abscess. Unremarkable unopacified urinary bladder. Stomach/Bowel: No evidence of obstruction, inflammatory process, or abnormal fluid collections. Vascular/Lymphatic: No pathologically enlarged lymph nodes identified. No evidence of abdominal aortic aneurysm. Reproductive:  No mass or other significant abnormality. Other:  None. Musculoskeletal:  No suspicious bone lesions identified. IMPRESSION: 5.5 cm heterogeneous low-attenuation lesion in lower pole of right kidney, which cannot be characterized on this unenhanced  exam. In the appropriate clinical setting, this is suspicious for pyelonephritis or renal abscess. Recommend correlation with urinalysis. Consider abdomen MRI or CT with contrast for further characterization. No evidence ureteral calculi or hydronephrosis. 3 cm low-attenuation lesion in anterior right hepatic lobe, which cannot be characterized on this unenhanced exam. Abdomen MRI without and with contrast is recommended for further characterization. Electronically Signed   By: Marlaine Hind M.D.   On: 11/15/2022 17:30    Procedures Procedures    Medications Ordered in ED Medications  acetaminophen (TYLENOL) tablet 650 mg (650 mg Oral Given 11/15/22 1338)  sodium chloride 0.9 % bolus 1,000 mL (0 mLs Intravenous Stopped 11/15/22 1719)  cefTRIAXone (ROCEPHIN) 2 g in sodium chloride 0.9 % 100 mL IVPB (0 g Intravenous Stopped 11/15/22 1622)  potassium chloride SA (KLOR-CON M) CR tablet 40 mEq (40 mEq Oral Given 11/15/22 1740)    ED Course/ Medical Decision Making/ A&P                             Medical Decision Making Amount and/or Complexity of Data Reviewed Labs: ordered. Radiology: ordered.  Risk OTC drugs. Prescription drug management.  This patient presents to the ED for concern of subjective fevers, fatigue, body aches, congestion, rhinorrhea, this involves an extensive number of treatment options, and is a complaint that carries with it a high risk of complications and morbidity.  The differential diagnosis includes flu, COVID, RSV, other respiratory infection  Co morbidities that complicate the patient evaluation   anemia  My initial workup includes respiratory panel, urinalysis, fever control  Additional history obtained from: Nursing notes from this visit.  I ordered, reviewed and interpreted labs which include: Respiratory panel, urinalysis, CBC, CMP, urinalysis, wet prep.  Wet prep shows trichomonas and clue cells. CBC shows stable anemia of 11.0.  CMP shows hyponatremia of  130 and hypokalemia of 2.8.  She states that she is typically hypokalemic  I ordered, reviewed and interpreted imaging including CT abdomen pelvis without contrast.  Signs negative with pyelonephritis.  Incidental finding of liver lesion.  Initially febrile to 100.4 Fahrenheit.  This was reduced with p.o. Tylenol.  Has also had a pulse of 100 bpm.  She states that this is typical for her.  Chart review reveals that her pulse is  typically around 100 bpm.  Otherwise hemodynamically stable.  27 year old female presenting to the ED for evaluation of URI type symptoms including fever, congestion, rhinorrhea, body aches, fatigue and back pain. on exam, she has tenderness to palpation of her back with some CVA tenderness.  Her urinalysis showed signs of UTI.  Patient was treated for pyelonephritis.  Her urinalysis also showed trichomonas.  Wet prep revealed clue cells and trichomonas.  This will be treated with Flagyl.  She was declining pelvic exam today, but denies other symptoms of PID.  She states she will schedule an appointment with her gynecologist in regards to her bacterial vaginosis and trichomonas.  She was declining chlamydia and gonorrhea swab.  Will proceed with urine testing.  Patient reported a history of contrast allergy.  CT abdomen pelvis noncontrast revealed signs indicating pyelonephritis versus renal abscess.  Patient will be treated for pyelonephritis with IV Rocephin and Keflex.  She was given strict return precautions.  She was encouraged to follow-up with her gynecologist regarding bacterial vaginosis and trichomonas diagnoses.  She states she will get an appointment on Monday.  She was also informed of the incidental finding on her liver on CT. she reports she will follow-up  with her primary care provider regarding this.  Low suspicion for emergent abnormalities.  LFTs are normal.  At this time there does not appear to be any evidence of an acute emergency medical condition and the patient  appears stable for discharge with appropriate outpatient follow up. Diagnosis was discussed with patient who verbalizes understanding of care plan and is agreeable to discharge. I have discussed return precautions with patient who verbalizes understanding. Patient encouraged to follow-up with their PCP within 1 week. All questions answered.  Note: Portions of this report may have been transcribed using voice recognition software. Every effort was made to ensure accuracy; however, inadvertent computerized transcription errors may still be present.        Final Clinical Impression(s) / ED Diagnoses Final diagnoses:  Bacterial vaginosis  Trichomonas vaginalis infection  Hypokalemia  Pyelonephritis  Liver lesion    Rx / DC Orders ED Discharge Orders          Ordered    potassium chloride SA (KLOR-CON M) 20 MEQ tablet  2 times daily        11/15/22 1744    metroNIDAZOLE (FLAGYL) 500 MG tablet  2 times daily        11/15/22 1744    cephALEXin (KEFLEX) 500 MG capsule  3 times daily        11/15/22 1744              Roylene Reason, Hershal Coria 11/15/22 1756    Margette Fast, MD 11/16/22 [phone]

## 2022-11-15 NOTE — Discharge Instructions (Addendum)
You have been seen today for your complaint of influenza-like illness and back pain. Your lab work was negative for influenza or [place].  Was positive for trichomonas and bacterial vaginosis as well as UTI.  Also showed low potassium levels. Your imaging showed signs of a kidney infection.  You will be treated for this with antibiotics.  You should return immediately if your symptoms do not improve. 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  It is recommended that you Claudia Knapp have this looked at with an MRI.  You should follow-up with your primary care provider regarding this. Your discharge medications include, Keflex and Flagyl.  These are antibiotics. You should take it as prescribed. You should take it for the entire duration of the prescription. This may cause an upset stomach. This is normal. You may take this with food. You may also eat yogurt to prevent diarrhea.  You cannot drink alcohol while taking Flagyl. Home care instructions are as follows:  Drink plenty of water.  Eat a normal diet Follow up with: Your primary care provider as needed Please seek immediate medical care if you develop any of the following symptoms: Are unable to take your antibiotics or fluids. Have shaking chills. Vomit. Have severe flank or back pain. Have extreme weakness or fainting. At this time there does not appear to be the presence of an emergent medical condition, however there is always the potential for conditions to change. Please read and follow the below instructions.  Do not take your medicine if  develop an itchy rash, swelling in your mouth or lips, or difficulty breathing; call 911 and seek immediate emergency medical attention if this occurs.  You may review your lab tests and imaging results in their entirety on your MyChart account.  Please discuss all results of fully with your primary care provider and other specialist at your follow-up visit.  Note: Portions of this text may have been transcribed  using voice recognition software. Every effort was made to ensure accuracy; however, inadvertent computerized transcription errors may still be present.

## 2022-11-17 LAB — URINE CULTURE: Culture: 5000 — AB

## 2022-11-17 LAB — GC/CHLAMYDIA PROBE AMP (~~LOC~~) NOT AT ARMC
Chlamydia: NEGATIVE
Comment: NEGATIVE
Comment: NORMAL
Neisseria Gonorrhea: NEGATIVE

## 2022-11-18 ENCOUNTER — Telehealth (HOSPITAL_BASED_OUTPATIENT_CLINIC_OR_DEPARTMENT_OTHER): Payer: Self-pay

## 2022-11-18 NOTE — Telephone Encounter (Signed)
Post ED Visit - Positive Culture Follow-up  Culture report reviewed by antimicrobial stewardship pharmacist: [facility] [x]$  Mal Misty Dohlen, Pharm.D. []$  Heide Guile, Pharm.D., BCPS AQ-ID []$  Parks Neptune, Pharm.D., BCPS []$  Alycia Rossetti, Pharm.D., BCPS []$  [place], Pharm.D., BCPS, AAHIVP []$  Legrand Como, Pharm.D., BCPS, AAHIVP []$  Salome Arnt, PharmD, BCPS []$  Johnnette Gourd, PharmD, BCPS []$  Hughes Better, PharmD, BCPS []$  Leeroy Cha, PharmD []$  Laqueta Linden, PharmD, BCPS []$  Albertina Parr, PharmD  [place] [facility] []$  Leodis Sias, PharmD []$  Lindell Spar, PharmD []$  Royetta Asal, PharmD []$  Graylin Shiver, Rph []$  Rema Fendt) Glennon Mac, PharmD []$  Arlyn Dunning, PharmD []$  Netta Cedars, PharmD []$  Dia Sitter, PharmD []$  Leone Haven, PharmD []$  Gretta Arab, PharmD []$  Theodis Shove, PharmD []$  Peggyann Juba, PharmD []$  Reuel Boom, PharmD   Positive urine culture Treated with Cephalexin, organism sensitive to the same and no further patient follow-up is required at this time.  Glennon Hamilton 11/18/2022, 11:26 AM

## 2024-01-29 ENCOUNTER — Emergency Department (HOSPITAL_BASED_OUTPATIENT_CLINIC_OR_DEPARTMENT_OTHER)
Admission: EM | Admit: 2024-01-29 | Discharge: 2024-01-29 | Disposition: A | Discharge status: Home / Self Care | Payer: Self-pay | Attending: Emergency Medicine | Admitting: Emergency Medicine

## 2024-01-29 ENCOUNTER — Encounter (HOSPITAL_BASED_OUTPATIENT_CLINIC_OR_DEPARTMENT_OTHER): Payer: Self-pay | Admitting: Urology

## 2024-01-29 ENCOUNTER — Other Ambulatory Visit: Payer: Self-pay

## 2024-01-29 DIAGNOSIS — Z91040 Latex allergy status: Secondary | ICD-10-CM | POA: Insufficient documentation

## 2024-01-29 DIAGNOSIS — K047 Periapical abscess without sinus: Secondary | ICD-10-CM | POA: Insufficient documentation

## 2024-01-29 MED ORDER — CLINDAMYCIN HCL 300 MG PO CAPS: 300.0000 mg | ORAL_CAPSULE | Freq: Three times a day (TID) | ORAL | 0 refills | Status: AC

## 2024-01-29 NOTE — Discharge Instructions (Signed)
 Return if any problems.

## 2024-01-29 NOTE — ED Triage Notes (Signed)
 Right lower dental abscess with facial swelling x 1 month on and off  Minimal pain noted

## 2024-01-29 NOTE — ED Provider Notes (Signed)
 [facility] Provider Note   CSN: [id number] Arrival date & time: 01/29/24  1011     History  Chief Complaint  Patient presents with   Facial Swelling    Claudia Knapp is a 28 y.o. female.  Patient complains of swelling to the right side of her face.  Patient reports that she has a tooth that has a hole in it that causes her to have problems.  Patient reports that she saw a dentist and was told that the tooth needed to be extracted.  Patient states that infection became better after taking antibiotics.  Patient denies any fever or chills she reports that she is having minimal pain however she has a lot of swelling.  Patient denies any fever or chills.  The history is provided by the patient. No language interpreter was used.       Home Medications Prior to Admission medications   Medication Sig Start Date End Date Taking? Authorizing Provider  clindamycin (CLEOCIN) 300 MG capsule Take 1 capsule (300 mg total) by mouth 3 (three) times daily for 10 days. 01/29/24 02/08/24 Yes Lanesha Azzaro K, PA-C  ibuprofen  (ADVIL ) 200 MG tablet Take 200-400 mg by mouth every 6 (six) hours as needed for mild pain or headache.    [provider]  metroNIDAZOLE  (FLAGYL ) 500 MG tablet Take 1 tablet (500 mg total) by mouth 2 (two) times daily. 11/15/22   Schutt, Coni Deep, PA-C  potassium chloride  SA (KLOR-CON  M) 20 MEQ tablet Take 1 tablet (20 mEq total) by mouth 2 (two) times daily for 4 days. 11/15/22 11/19/22  Schutt, Coni Deep, PA-C      Allergies    Maxalt-mlt [rizatriptan benzoate], Iohexol, and Latex    Review of Systems   Review of Systems  All other systems reviewed and are negative.   Physical Exam Updated Vital Signs BP (!) 136/107 (BP Location: Right Arm)   Pulse (!) 104   Temp 98.4 F (36.9 C) (Oral)   Resp 18   Ht 5\' 1"  (1.549 m)   Wt 66.2 kg   SpO2 99%   BMI 27.58 kg/m  Physical Exam Vitals and nursing note  reviewed.  Constitutional:      Appearance: She is well-developed.  HENT:     Head: Normocephalic.     Mouth/Throat:     Mouth: Mucous membranes are moist.     Comments: Swollen right cheek, cavity right lower molar, no sign of [place]'s.  No lymphadenopathy Cardiovascular:     Rate and Rhythm: Normal rate.  Pulmonary:     Effort: Pulmonary effort is normal.  Abdominal:     General: There is no distension.  Musculoskeletal:        General: Normal range of motion.     Cervical back: Normal range of motion.  Skin:    General: Skin is warm.  Neurological:     General: No focal deficit present.     Mental Status: She is alert and oriented to person, place, and time.     ED Results / Procedures / Treatments   Labs (all labs ordered are listed, but only abnormal results are displayed) Labs Reviewed - No data to display  EKG None  Radiology No results found.  Procedures Procedures    Medications Ordered in ED Medications - No data to display  ED Course/ Medical Decision Making/ A&P  Medical Decision Making Patient complains of swelling in the right side of her mouth.  She has a tooth that has a hole in it  Risk Prescription drug management. Risk Details: Patient is given a prescription for clindamycin.  She is given the phone number for a dentist to follow-up with.           Final Clinical Impression(s) / ED Diagnoses Final diagnoses:  Dental abscess    Rx / DC Orders ED Discharge Orders          Ordered    clindamycin (CLEOCIN) 300 MG capsule  3 times daily        01/29/24 1101           An After Visit Summary was printed and given to the patient.    Claudia Caruth K, PA-C 01/29/24 1105    Mozell Arias, MD 01/29/24 [phone]
